# Patient Record
Sex: Female | Born: 1990 | Race: Black or African American | Hispanic: No | Marital: Single | Smoking: Never smoker
Health system: Southern US, Community
[De-identification: ages and names within clinical notes are randomized; demographics above are authoritative.]

## PROBLEM LIST (undated history)

## (undated) DIAGNOSIS — J45909 Unspecified asthma, uncomplicated: Secondary | ICD-10-CM

## (undated) DIAGNOSIS — D573 Sickle-cell trait: Secondary | ICD-10-CM

## (undated) HISTORY — PX: CHOLECYSTECTOMY: SHX55

---

## 2016-01-27 DIAGNOSIS — J45909 Unspecified asthma, uncomplicated: Secondary | ICD-10-CM | POA: Insufficient documentation

## 2016-01-27 DIAGNOSIS — K5904 Chronic idiopathic constipation: Secondary | ICD-10-CM | POA: Insufficient documentation

## 2016-01-27 DIAGNOSIS — D573 Sickle-cell trait: Secondary | ICD-10-CM | POA: Insufficient documentation

## 2016-08-20 ENCOUNTER — Encounter (HOSPITAL_COMMUNITY): Payer: Self-pay | Admitting: Emergency Medicine

## 2016-08-20 ENCOUNTER — Emergency Department (HOSPITAL_COMMUNITY)
Admission: EM | Admit: 2016-08-20 | Discharge: 2016-08-20 | Disposition: A | Discharge status: Home / Self Care | Payer: Self-pay | Attending: Emergency Medicine | Admitting: Emergency Medicine

## 2016-08-20 DIAGNOSIS — R110 Nausea: Secondary | ICD-10-CM | POA: Insufficient documentation

## 2016-08-20 DIAGNOSIS — R197 Diarrhea, unspecified: Secondary | ICD-10-CM | POA: Insufficient documentation

## 2016-08-20 HISTORY — DX: Unspecified asthma, uncomplicated: J45.909

## 2016-08-20 HISTORY — DX: Sickle-cell trait: D57.3

## 2016-08-20 LAB — COMPREHENSIVE METABOLIC PANEL
ALBUMIN: 3.8 g/dL (ref 3.5–5.0)
ALT: 108 U/L — AB (ref 14–54)
AST: 66 U/L — AB (ref 15–41)
Alkaline Phosphatase: 113 U/L (ref 38–126)
Anion gap: 7 (ref 5–15)
BILIRUBIN TOTAL: 0.5 mg/dL (ref 0.3–1.2)
BUN: 10 mg/dL (ref 6–20)
CHLORIDE: 105 mmol/L (ref 101–111)
CO2: 27 mmol/L (ref 22–32)
CREATININE: 0.81 mg/dL (ref 0.44–1.00)
Calcium: 9.4 mg/dL (ref 8.9–10.3)
GFR calc Af Amer: >60 mL/min (ref >60)
GFR calc non Af Amer: >60 mL/min (ref >60)
GLUCOSE: 96 mg/dL (ref 65–99)
POTASSIUM: 4 mmol/L (ref 3.5–5.1)
Sodium: 139 mmol/L (ref 135–145)
Total Protein: 7.5 g/dL (ref 6.5–8.1)

## 2016-08-20 LAB — CBC
HCT: 38.3 % (ref 36.0–46.0)
Hemoglobin: 13.6 g/dL (ref 12.0–15.0)
MCH: 28.3 pg (ref 26.0–34.0)
MCHC: 35.5 g/dL (ref 30.0–36.0)
MCV: 79.6 fL (ref 78.0–100.0)
PLATELETS: 261 10*3/uL (ref 150–400)
RBC: 4.81 MIL/uL (ref 3.87–5.11)
RDW: 13.9 % (ref 11.5–15.5)
WBC: 4.7 10*3/uL (ref 4.0–10.5)

## 2016-08-20 LAB — URINALYSIS, ROUTINE W REFLEX MICROSCOPIC
BILIRUBIN URINE: NEGATIVE
GLUCOSE, UA: NEGATIVE mg/dL
HGB URINE DIPSTICK: NEGATIVE
KETONES UR: NEGATIVE mg/dL
Leukocytes, UA: NEGATIVE
Nitrite: NEGATIVE
PH: 8 (ref 5.0–8.0)
Protein, ur: NEGATIVE mg/dL
Specific Gravity, Urine: 1.021 (ref 1.005–1.030)

## 2016-08-20 LAB — I-STAT BETA HCG BLOOD, ED (MC, WL, AP ONLY): I-stat hCG, quantitative: <5 m[IU]/mL (ref <5)

## 2016-08-20 LAB — LIPASE, BLOOD: Lipase: 20 U/L (ref 11–51)

## 2016-08-20 MED ORDER — ONDANSETRON 4 MG PO TBDP
4.0000 mg | ORAL_TABLET | Freq: Once | ORAL | Status: AC
  Administered 2016-08-20: 4 mg via ORAL
  Filled 2016-08-20: qty 1

## 2016-08-20 MED ORDER — ONDANSETRON HCL 4 MG PO TABS: 4.0000 mg | ORAL_TABLET | Freq: Four times a day (QID) | ORAL | 0 refills | Status: DC

## 2016-08-20 NOTE — Discharge Instructions (Signed)
Stay on clear liquids for the next 12 hours and then advance to the diarrhea diet. Take the medication for nausea as needed. Return for worsening symptoms

## 2016-08-20 NOTE — ED Notes (Signed)
Fluids given. Pt tolerating currently. Will continue to monitor.

## 2016-08-20 NOTE — ED Triage Notes (Signed)
Pt with upper abdominal pain for the last 2 days. Reports nausea but no vomiting. LMP Nov 26, 17. Pt unsure if pregnant. Reports fever to 101 two days ago. Pt awake, alert, NAD at present. VSS.

## 2016-08-20 NOTE — ED Provider Notes (Signed)
[facility]   CSN: [id number] Arrival date & time: 08/20/16  1808   By signing my name below, I, Clovis PuAvnee Patel, attest that this documentation has been prepared under the direction and in the presence of  Kerrie BuffaloHope Neese, NP. Electronically Signed: Clovis PuAvnee Patel, ED Scribe. 08/20/16. 9:06 PM.   History   Chief Complaint Chief Complaint  Patient presents with  . Abdominal Pain    The history is provided by the patient. No language interpreter was used.  Abdominal Pain   This is a new problem. The current episode started 2 days ago. The problem has not changed since onset.The pain is located in the suprapubic region. The pain is moderate. Associated symptoms include diarrhea, nausea and frequency. Pertinent negatives include fever, vomiting and dysuria. Nothing aggravates the symptoms. Nothing relieves the symptoms.    8:53 PM: Micah FlesherWent to see pt but she was not placed in the room.   HPI Comments:  Deanna Castaneda is a 26 y.o. female, with a PSHx of cholecystectomy, who presents to the Emergency Department complaining of epigastric abdominal pain x 2 days. Pt also reports nausea, diarrhea with loose stool, frequency and urgency. She also reports a cough and congestion. Pt denies fevers, chills, vomiting, diarrhea, dysuria, vaginal discharge, vaginal bleeding, sick contacts, birth control use, concerns for STDs and any other associated symptoms at this time. No PCP in area. No daily medication use or major medical problems.   Past Medical History:  Diagnosis Date  . Asthma   . Sickle cell trait (HCC)     There are no active problems to display for this patient.   Past Surgical History:  Procedure Laterality Date  . CHOLECYSTECTOMY      OB History    No data available       Home Medications    Prior to Admission medications   Medication Sig Start Date End Date Taking? Authorizing Provider  ondansetron (ZOFRAN) 4 MG tablet Take 1 tablet (4 mg total) by  mouth every 6 (six) hours. 08/20/16   Hope Orlene OchM Neese, NP    Family History History reviewed. No pertinent family history.  Social History Social History  Substance Use Topics  . Smoking status: Never Smoker  . Smokeless tobacco: Not on file  . Alcohol use No     Allergies   Patient has no known allergies.   Review of Systems Review of Systems  Constitutional: Negative for fever.  Gastrointestinal: Positive for abdominal pain, diarrhea and nausea. Negative for vomiting.  Genitourinary: Positive for frequency. Negative for dysuria.     Physical Exam Updated Vital Signs BP 121/76   Pulse 81   Temp 98.6 F (37 C) (Oral)   Resp 18   LMP 06/27/2016   SpO2 98%   Physical Exam  Constitutional: She is oriented to person, place, and time. She appears well-developed and well-nourished. No distress.  HENT:  Head: Normocephalic and atraumatic.  Eyes: Conjunctivae are normal.  Cardiovascular: Normal rate, regular rhythm and normal heart sounds.   Pulmonary/Chest: Effort normal.  Abdominal: Soft. She exhibits no distension. There is tenderness in the epigastric area.  Neurological: She is alert and oriented to person, place, and time.  Skin: Skin is warm and dry.  Psychiatric: She has a normal mood and affect.  Nursing note and vitals reviewed.   ED Treatments / Results  DIAGNOSTIC STUDIES:  Oxygen Saturation is 100% on RA, normal by my interpretation.    COORDINATION OF CARE:  9:02 PM  Discussed treatment plan with pt at bedside and pt agreed to plan.  Labs (all labs ordered are listed, but only abnormal results are displayed) Labs Reviewed  COMPREHENSIVE METABOLIC PANEL - Abnormal; Notable for the following:       Result Value   AST 66 (*)    ALT 108 (*)    All other components within normal limits  URINALYSIS, ROUTINE W REFLEX MICROSCOPIC - Abnormal; Notable for the following:    APPearance HAZY (*)    All other components within normal limits  LIPASE, BLOOD    CBC  I-STAT BETA HCG BLOOD, ED ([facility], [facility], [facility] ONLY)    Radiology No results found.  Procedures Procedures (including critical care time)  Medications Ordered in ED Medications  ondansetron (ZOFRAN-ODT) disintegrating tablet 4 mg (4 mg Oral Given 08/20/16 2135)     Initial Impression / Assessment and Plan / ED Course  I have reviewed the triage vital signs and the nursing notes.  Pertinent labs & imaging results that were available during my care of the patient were reviewed by me and considered in my medical decision making (see chart for details).     Patient is nontoxic, nonseptic appearing, in no apparent distress.  Patient's pain and other symptoms adequately managed in emergency department. Labs and vitals reviewed.  Patient does not meet the SIRS or Sepsis criteria.  Patient discharged home with symptomatic treatment and given strict instructions for follow-up with their primary care physician.  I have also discussed reasons to return immediately to the ER.  Patient expresses understanding and agrees with plan.   Final Clinical Impressions(s) / ED Diagnoses   Final diagnoses:  Diarrhea, unspecified type  Nausea    New Prescriptions Discharge Medication List as of 08/20/2016 10:22 PM    START taking these medications   Details  ondansetron (ZOFRAN) 4 MG tablet Take 1 tablet (4 mg total) by mouth every 6 (six) hours., Starting Fri 08/20/2016, Print      I personally performed the services described in this documentation, which was scribed in my presence. The recorded information has been reviewed and is accurate.     [address] [place], NP 08/21/16 1558    Blane Ohara, MD 08/23/16 [id number]

## 2016-10-06 ENCOUNTER — Other Ambulatory Visit: Payer: Self-pay | Admitting: Orthopedic Surgery

## 2016-10-06 DIAGNOSIS — M79601 Pain in right arm: Secondary | ICD-10-CM

## 2016-10-19 ENCOUNTER — Ambulatory Visit
Admission: RE | Admit: 2016-10-19 | Discharge: 2016-10-19 | Disposition: A | Discharge status: Home / Self Care | Payer: BLUE CROSS/BLUE SHIELD | Source: Ambulatory Visit | Attending: Orthopedic Surgery | Admitting: Orthopedic Surgery

## 2016-10-19 DIAGNOSIS — M79601 Pain in right arm: Secondary | ICD-10-CM

## 2016-11-25 ENCOUNTER — Encounter (HOSPITAL_COMMUNITY): Payer: Self-pay

## 2016-11-25 ENCOUNTER — Emergency Department (HOSPITAL_COMMUNITY)
Admission: EM | Admit: 2016-11-25 | Discharge: 2016-11-25 | Disposition: A | Discharge status: Home / Self Care | Payer: BLUE CROSS/BLUE SHIELD | Attending: Emergency Medicine | Admitting: Emergency Medicine

## 2016-11-25 DIAGNOSIS — M25511 Pain in right shoulder: Secondary | ICD-10-CM | POA: Diagnosis present

## 2016-11-25 DIAGNOSIS — M542 Cervicalgia: Secondary | ICD-10-CM | POA: Insufficient documentation

## 2016-11-25 DIAGNOSIS — M79601 Pain in right arm: Secondary | ICD-10-CM

## 2016-11-25 DIAGNOSIS — J45909 Unspecified asthma, uncomplicated: Secondary | ICD-10-CM | POA: Diagnosis not present

## 2016-11-25 MED ORDER — METHOCARBAMOL 500 MG PO TABS: 500.0000 mg | ORAL_TABLET | Freq: Two times a day (BID) | ORAL | 0 refills | Status: DC

## 2016-11-25 MED ORDER — PREDNISONE 10 MG PO TABS: 20.0000 mg | ORAL_TABLET | Freq: Two times a day (BID) | ORAL | 0 refills | Status: AC

## 2016-11-25 MED ORDER — IBUPROFEN 600 MG PO TABS: 600.0000 mg | ORAL_TABLET | Freq: Four times a day (QID) | ORAL | 0 refills | Status: DC | PRN

## 2016-11-25 MED ORDER — KETOROLAC TROMETHAMINE 60 MG/2ML IM SOLN
60.0000 mg | Freq: Once | INTRAMUSCULAR | Status: AC
  Administered 2016-11-25: 60 mg via INTRAMUSCULAR
  Filled 2016-11-25: qty 2

## 2016-11-25 MED ORDER — LIDOCAINE 5 % EX PTCH: 1.0000 | MEDICATED_PATCH | CUTANEOUS | 0 refills | Status: DC

## 2016-11-25 MED ORDER — DICLOFENAC SODIUM 1 % TD GEL: 4.0000 g | Freq: Four times a day (QID) | TRANSDERMAL | 0 refills | Status: DC

## 2016-11-25 NOTE — Discharge Instructions (Signed)
Take it easy, but do not lay around too much as this may make any stiffness worse.  Antiinflammatory medications: Take 500 mg of naproxen every 12 hours or 600 mg of ibuprofen every 6 hours for the next 3 days. Take these medications with food to avoid upset stomach. Choose only one of these medications, do not take them together.  Diclofenac: If the ibuprofen or naproxen are not improving the pain, stop taking the ibuprofen or naproxen and begin using the diclofenac gel.  Prednisone: Take the prednisone as prescribed.  Muscle relaxer: Robaxin is a muscle relaxer and may help loosen stiff muscles. Do not take the Robaxin while driving or performing other dangerous activities.   Lidocaine patches: These are available via either prescription or over-the-counter. The over-the-counter option may be more economical one and are likely just as effective. There are multiple over-the-counter brands, such as Salonpas.  Exercises: Be sure to perform the attached exercises starting with three times a week and working up to performing them daily. This is an essential part of preventing long term problems. Note: The attached sheet labeled "Cervical Strain and Sprain" is included for the sake of the exercises, you are not being diagnosed with this medication condition.  Follow-up with the orthopedic specialists, as scheduled.

## 2016-11-25 NOTE — ED Triage Notes (Signed)
Patient here with chronic pain to right shoulder and arm, states that the pain now radiating up neck. Had work injury in November. Has been seen by ortho ongoing for same

## 2016-11-25 NOTE — ED Notes (Signed)
Pt is in stable condition upon d/c and ambulates from ED. 
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
## 2017-01-21 ENCOUNTER — Other Ambulatory Visit (HOSPITAL_COMMUNITY): Payer: Self-pay | Admitting: Obstetrics & Gynecology

## 2017-01-21 DIAGNOSIS — Z3141 Encounter for fertility testing: Secondary | ICD-10-CM

## 2017-01-26 ENCOUNTER — Ambulatory Visit (HOSPITAL_COMMUNITY)
Admission: RE | Admit: 2017-01-26 | Discharge: 2017-01-26 | Disposition: A | Discharge status: Home / Self Care | Payer: BLUE CROSS/BLUE SHIELD | Source: Ambulatory Visit | Attending: Obstetrics & Gynecology | Admitting: Obstetrics & Gynecology

## 2017-01-26 DIAGNOSIS — Z3141 Encounter for fertility testing: Secondary | ICD-10-CM

## 2017-01-26 MED ORDER — IOPAMIDOL (ISOVUE-300) INJECTION 61%
30.0000 mL | Freq: Once | INTRAVENOUS | Status: AC | PRN
  Administered 2017-01-26: 6 mL

## 2017-02-15 ENCOUNTER — Emergency Department (HOSPITAL_COMMUNITY): Payer: No Typology Code available for payment source

## 2017-02-15 ENCOUNTER — Emergency Department (HOSPITAL_COMMUNITY)
Admission: EM | Admit: 2017-02-15 | Discharge: 2017-02-15 | Disposition: A | Discharge status: Home / Self Care | Payer: No Typology Code available for payment source | Attending: Emergency Medicine | Admitting: Emergency Medicine

## 2017-02-15 ENCOUNTER — Other Ambulatory Visit (HOSPITAL_COMMUNITY): Payer: Self-pay

## 2017-02-15 ENCOUNTER — Encounter (HOSPITAL_COMMUNITY): Payer: Self-pay

## 2017-02-15 DIAGNOSIS — M791 Myalgia, unspecified site: Secondary | ICD-10-CM

## 2017-02-15 DIAGNOSIS — Y9389 Activity, other specified: Secondary | ICD-10-CM | POA: Diagnosis not present

## 2017-02-15 DIAGNOSIS — Y998 Other external cause status: Secondary | ICD-10-CM | POA: Insufficient documentation

## 2017-02-15 DIAGNOSIS — Z79899 Other long term (current) drug therapy: Secondary | ICD-10-CM | POA: Insufficient documentation

## 2017-02-15 DIAGNOSIS — Z9049 Acquired absence of other specified parts of digestive tract: Secondary | ICD-10-CM | POA: Insufficient documentation

## 2017-02-15 DIAGNOSIS — D573 Sickle-cell trait: Secondary | ICD-10-CM | POA: Insufficient documentation

## 2017-02-15 DIAGNOSIS — M545 Low back pain, unspecified: Secondary | ICD-10-CM

## 2017-02-15 DIAGNOSIS — Y92410 Unspecified street and highway as the place of occurrence of the external cause: Secondary | ICD-10-CM | POA: Insufficient documentation

## 2017-02-15 DIAGNOSIS — J45909 Unspecified asthma, uncomplicated: Secondary | ICD-10-CM | POA: Insufficient documentation

## 2017-02-15 LAB — POC URINE PREG, ED: Preg Test, Ur: NEGATIVE

## 2017-02-15 MED ORDER — IBUPROFEN 800 MG PO TABS: 800.0000 mg | ORAL_TABLET | Freq: Three times a day (TID) | ORAL | 0 refills | Status: DC | PRN

## 2017-02-15 MED ORDER — METHOCARBAMOL 500 MG PO TABS: 500.0000 mg | ORAL_TABLET | Freq: Two times a day (BID) | ORAL | 0 refills | Status: DC | PRN

## 2017-02-15 MED ORDER — IBUPROFEN 800 MG PO TABS
800.0000 mg | ORAL_TABLET | Freq: Once | ORAL | Status: AC
  Administered 2017-02-15: 800 mg via ORAL
  Filled 2017-02-15: qty 1

## 2017-02-15 MED ORDER — METHOCARBAMOL 1000 MG/10ML IJ SOLN: 1000.0000 mg | Freq: Once | INTRAMUSCULAR | Status: DC

## 2017-02-15 MED ORDER — METHOCARBAMOL 500 MG PO TABS
500.0000 mg | ORAL_TABLET | Freq: Once | ORAL | Status: AC
  Administered 2017-02-15: 500 mg via ORAL
  Filled 2017-02-15: qty 1
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
## 2017-02-15 NOTE — ED Notes (Signed)
Pt returned from X-ray.  
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
## 2017-08-03 ENCOUNTER — Encounter (HOSPITAL_COMMUNITY): Payer: Self-pay | Admitting: *Deleted

## 2017-08-03 ENCOUNTER — Other Ambulatory Visit: Payer: Self-pay

## 2017-08-03 ENCOUNTER — Emergency Department (HOSPITAL_COMMUNITY)
Admission: EM | Admit: 2017-08-03 | Discharge: 2017-08-03 | Disposition: A | Discharge status: Home / Self Care | Payer: BLUE CROSS/BLUE SHIELD | Attending: Emergency Medicine | Admitting: Emergency Medicine

## 2017-08-03 ENCOUNTER — Emergency Department (HOSPITAL_COMMUNITY): Payer: BLUE CROSS/BLUE SHIELD

## 2017-08-03 DIAGNOSIS — R112 Nausea with vomiting, unspecified: Secondary | ICD-10-CM | POA: Insufficient documentation

## 2017-08-03 DIAGNOSIS — B9689 Other specified bacterial agents as the cause of diseases classified elsewhere: Secondary | ICD-10-CM | POA: Insufficient documentation

## 2017-08-03 DIAGNOSIS — R197 Diarrhea, unspecified: Secondary | ICD-10-CM | POA: Insufficient documentation

## 2017-08-03 DIAGNOSIS — J45909 Unspecified asthma, uncomplicated: Secondary | ICD-10-CM | POA: Insufficient documentation

## 2017-08-03 DIAGNOSIS — R1084 Generalized abdominal pain: Secondary | ICD-10-CM

## 2017-08-03 DIAGNOSIS — N760 Acute vaginitis: Secondary | ICD-10-CM

## 2017-08-03 DIAGNOSIS — R509 Fever, unspecified: Secondary | ICD-10-CM | POA: Insufficient documentation

## 2017-08-03 LAB — COMPREHENSIVE METABOLIC PANEL
ALT: 74 U/L — ABNORMAL HIGH (ref 14–54)
AST: 61 U/L — AB (ref 15–41)
Albumin: 3.2 g/dL — ABNORMAL LOW (ref 3.5–5.0)
Alkaline Phosphatase: 80 U/L (ref 38–126)
Anion gap: 6 (ref 5–15)
BILIRUBIN TOTAL: 0.6 mg/dL (ref 0.3–1.2)
BUN: 13 mg/dL (ref 6–20)
CALCIUM: 9.2 mg/dL (ref 8.9–10.3)
CO2: 23 mmol/L (ref 22–32)
CREATININE: 0.82 mg/dL (ref 0.44–1.00)
Chloride: 107 mmol/L (ref 101–111)
Glucose, Bld: 114 mg/dL — ABNORMAL HIGH (ref 65–99)
Potassium: 3.5 mmol/L (ref 3.5–5.1)
Sodium: 136 mmol/L (ref 135–145)
TOTAL PROTEIN: 7.4 g/dL (ref 6.5–8.1)

## 2017-08-03 LAB — GC/CHLAMYDIA PROBE AMP (~~LOC~~) NOT AT ARMC
Chlamydia: NEGATIVE
Neisseria Gonorrhea: NEGATIVE

## 2017-08-03 LAB — CBC
HCT: 35.4 % — ABNORMAL LOW (ref 36.0–46.0)
Hemoglobin: 12.3 g/dL (ref 12.0–15.0)
MCH: 27.8 pg (ref 26.0–34.0)
MCHC: 34.7 g/dL (ref 30.0–36.0)
MCV: 80.1 fL (ref 78.0–100.0)
PLATELETS: 298 10*3/uL (ref 150–400)
RBC: 4.42 MIL/uL (ref 3.87–5.11)
RDW: 14.3 % (ref 11.5–15.5)
WBC: 6.3 10*3/uL (ref 4.0–10.5)

## 2017-08-03 LAB — URINALYSIS, ROUTINE W REFLEX MICROSCOPIC
Bilirubin Urine: NEGATIVE
GLUCOSE, UA: NEGATIVE mg/dL
Hgb urine dipstick: NEGATIVE
KETONES UR: NEGATIVE mg/dL
LEUKOCYTES UA: NEGATIVE
NITRITE: NEGATIVE
PROTEIN: NEGATIVE mg/dL
Specific Gravity, Urine: 1.027 (ref 1.005–1.030)
pH: 5 (ref 5.0–8.0)

## 2017-08-03 LAB — I-STAT BETA HCG BLOOD, ED (MC, WL, AP ONLY)

## 2017-08-03 LAB — WET PREP, GENITAL
SPERM: NONE SEEN
Trich, Wet Prep: NONE SEEN
YEAST WET PREP: NONE SEEN

## 2017-08-03 LAB — LIPASE, BLOOD: Lipase: 31 U/L (ref 11–51)

## 2017-08-03 MED ORDER — IOPAMIDOL (ISOVUE-300) INJECTION 61%
INTRAVENOUS | Status: AC
  Administered 2017-08-03: 100 mL
  Filled 2017-08-03: qty 100

## 2017-08-03 MED ORDER — METRONIDAZOLE 500 MG PO TABS
500.0000 mg | ORAL_TABLET | Freq: Once | ORAL | Status: AC
  Administered 2017-08-03: 500 mg via ORAL
  Filled 2017-08-03: qty 1

## 2017-08-03 MED ORDER — MORPHINE SULFATE (PF) 4 MG/ML IV SOLN
4.0000 mg | Freq: Once | INTRAVENOUS | Status: AC
  Administered 2017-08-03: 4 mg via INTRAVENOUS
  Filled 2017-08-03: qty 1

## 2017-08-03 MED ORDER — ONDANSETRON HCL 4 MG/2ML IJ SOLN
4.0000 mg | Freq: Once | INTRAMUSCULAR | Status: AC
  Administered 2017-08-03: 4 mg via INTRAVENOUS
  Filled 2017-08-03: qty 2

## 2017-08-03 MED ORDER — SODIUM CHLORIDE 0.9 % IV BOLUS (SEPSIS)
1000.0000 mL | Freq: Once | INTRAVENOUS | Status: AC
  Administered 2017-08-03: 1000 mL via INTRAVENOUS

## 2017-08-03 MED ORDER — METRONIDAZOLE 500 MG PO TABS: 500.0000 mg | ORAL_TABLET | Freq: Two times a day (BID) | ORAL | 0 refills | Status: DC

## 2017-08-03 NOTE — ED Provider Notes (Signed)
[facility]  Refer to provider note for full history and physical examination.  Briefly, patient is a 27 year old female with a history of cholecystectomy complaining of diffuse abdominal pain as well as nausea, vomiting, and diarrhea.  Workup is reassuring.  Awaiting wet prep.  If wet prep is normal, patient stable for discharge.  If wet prep is abnormal, will treat for abnormalities.  She declined STD testing. Physical Exam  BP 95/67 (BP Location: Left Arm)   Pulse 79   Temp 98.6 F (37 C) (Oral)   Resp 14   Ht 5\' 2"  (1.575 m)   Wt 90.7 kg (200 lb)   LMP 07/03/2017   SpO2 100%   BMI 36.58 kg/m   Physical Exam  Constitutional: She appears well-developed and well-nourished. No distress.  HENT:  Head: Normocephalic and atraumatic.  Eyes: Conjunctivae are normal. Right eye exhibits no discharge. Left eye exhibits no discharge.  Neck: No JVD present. No tracheal deviation present.  Cardiovascular: Normal rate, normal heart sounds and intact distal pulses.  Pulmonary/Chest: Effort normal and breath sounds normal.  Abdominal: Soft. Bowel sounds are normal. She exhibits no distension. There is tenderness in the left upper quadrant.  Mild left upper quadrant tenderness  Musculoskeletal: She exhibits no edema.  Neurological: She is alert.  Skin: Skin is warm and dry. No erythema.  Psychiatric: She has a normal mood and affect. Her behavior is normal.  Nursing note and vitals reviewed.   ED Course/Procedures     Procedures  MDM  8:35 AM  Patient resting comfortably in no apparent distress.  She is complaining of mild left upper quadrant tenderness but otherwise states she is feeling better and has been tolerating p.o. food and fluids.  Wet prep shows WBCs and clue cells consistent with BV.  We will treat with Flagyl.  [facility].  Discussed indications for return to the ED. Pt verbalized understanding of and  agreement with plan and is safe for discharge home at this time.  No complaints prior to discharge.         Deanna Castaneda 08/03/17 [id number]    Deanna Castaneda 08/03/17 2110

## 2017-08-03 NOTE — ED Triage Notes (Signed)
The pt is c/o abd pain and vomiting today  lmp  Dec 1st

## 2017-08-03 NOTE — ED Provider Notes (Signed)
[place] EMERGENCY DEPARTMENT Provider Note   CSN: [id number] Arrival date & time: 08/03/17  0025     History   Chief Complaint Chief Complaint  Patient presents with  . Abdominal Pain    HPI Deanna Castaneda is a 27 y.o. female past medical history of cholecystectomy who presents for evaluation of multiple complaints.  Patient reports that over the last few days, she has had diffuse abdominal pain.  Patient also reports associated nausea.  Patient reports that she had one episode of vomiting tonight approximate 7 PM.  Nonbloody, nonbilious.  Patient reports that she had one episode of diarrhea today.  Patient reports that she felt subjective fever and chills but did not measure any fever.  Patient states that the abdominal pain has been intermittent for the last several days.  She denies any alleviating or aggravating factors.  She is not taking any medications for the pain.  Patient reports that she is still been able to tolerate p.o. but reports nausea.  Patient also reports that over the last few weeks, she has had intermittent vaginal bleeding.  She states she is only going through about 1 pad a day.  She denies any presence of clots.  She had arrange for an appointment with the health department for evaluation of vaginal bleeding.  Patient reports she is currently sexually active with one partner.  They do not use protection.  Patient denies any chest pain, difficulty breathing, dysuria, hematuria, vaginal discharge.  The history is provided by the patient.    Past Medical History:  Diagnosis Date  . Asthma   . Sickle cell trait ([place])     There are no active problems to display for this patient.   Past Surgical History:  Procedure Laterality Date  . CHOLECYSTECTOMY      OB History    No data available       Home Medications    Prior to Admission medications   Medication Sig Start Date End Date Taking? Authorizing Provider  diclofenac sodium  (VOLTAREN) 1 % GEL Apply 4 g topically 4 (four) times daily. Patient not taking: Reported on 08/03/2017 11/25/16   Lorayne Bender, PA-C  ibuprofen (ADVIL,MOTRIN) 800 MG tablet Take 1 tablet (800 mg total) by mouth every 8 (eight) hours as needed. Patient not taking: Reported on 08/03/2017 02/15/17   Ward, Ozella Almond, PA-C  lidocaine (LIDODERM) 5 % Place 1 patch onto the skin daily. Remove & Discard patch within 12 hours or as directed by MD Patient not taking: Reported on 08/03/2017 11/25/16   Joy, Helane Gunther, PA-C  methocarbamol (ROBAXIN) 500 MG tablet Take 1 tablet (500 mg total) by mouth 2 (two) times daily as needed for muscle spasms. Patient not taking: Reported on 08/03/2017 02/15/17   Ward, Ozella Almond, PA-C  ondansetron (ZOFRAN) 4 MG tablet Take 1 tablet (4 mg total) by mouth every 6 (six) hours. Patient not taking: Reported on 08/03/2017 08/20/16   Ashley Murrain, NP    Family History No family history on file.  Social History Social History   Tobacco Use  . Smoking status: Never Smoker  . Smokeless tobacco: Never Used  Substance Use Topics  . Alcohol use: No  . Drug use: No     Allergies   Patient has no known allergies.   Review of Systems Review of Systems  Constitutional: Positive for fever (Subjective). Negative for chills.  HENT: Negative for congestion.   Eyes: Negative for visual disturbance.  Respiratory: Negative for cough and shortness of breath.   Cardiovascular: Negative for chest pain.  Gastrointestinal: Positive for abdominal pain, diarrhea, nausea and vomiting.  Genitourinary: Positive for vaginal bleeding. Negative for dysuria, hematuria and vaginal discharge.  Musculoskeletal: Negative for back pain and neck pain.  Skin: Negative for rash.     Physical Exam Updated Vital Signs BP 95/67 (BP Location: Left Arm)   Pulse 79   Temp 98.6 F (37 C) (Oral)   Resp 14   Ht 5' 2"  (1.575 m)   Wt 90.7 kg (200 lb)   LMP 07/03/2017   SpO2 100%   BMI 36.58 kg/m    Physical Exam  Constitutional: She is oriented to person, place, and time. She appears well-developed and well-nourished.  HENT:  Head: Normocephalic and atraumatic.  Mouth/Throat: Oropharynx is clear and moist and mucous membranes are normal.  Eyes: Conjunctivae, EOM and lids are normal. Pupils are equal, round, and reactive to light.  Neck: Full passive range of motion without pain.  Cardiovascular: Normal rate, regular rhythm, normal heart sounds and normal pulses. Exam reveals no gallop and no friction rub.  No murmur heard. Pulmonary/Chest: Effort normal and breath sounds normal.  Abdominal: Soft. Normal appearance. There is generalized tenderness and tenderness in the suprapubic area. There is no rigidity, no guarding and no tenderness at McBurney's point.  Abdomen is soft, nondistended.  Patient has generalized tenderness but does have some focal suprapubic tenderness.  No CVA tenderness bilaterally.  No tenderness at McBurney's point. No rigidity or guarding.   Genitourinary: Vagina normal and uterus normal. Cervix exhibits no motion tenderness, no discharge and no friability. Right adnexum displays no mass and no tenderness. Left adnexum displays no mass and no tenderness.  Genitourinary Comments: The exam was performed with a chaperone present. Normal external female genitalia. No lesions, rash, or sores.  No evidence of mass, fluctuance, swelling, erythema of the labia majora.  No evidence of Bartholin's abscess.  Patient has no tenderness palpation of the labia. No blood in the vaginal vault. Small bloody discharge at the cervix. No friability. No CMT.  No adnexal mass or tenderness bilaterally.  Musculoskeletal: Normal range of motion.  Neurological: She is alert and oriented to person, place, and time.  Skin: Skin is warm and dry. Capillary refill takes less than 2 seconds.  Psychiatric: She has a normal mood and affect. Her speech is normal.  Nursing note and vitals  reviewed.    ED Treatments / Results  Labs (all labs ordered are listed, but only abnormal results are displayed) Labs Reviewed  COMPREHENSIVE METABOLIC PANEL - Abnormal; Notable for the following components:      Result Value   Glucose, Bld 114 (*)    Albumin 3.2 (*)    AST 61 (*)    ALT 74 (*)    All other components within normal limits  CBC - Abnormal; Notable for the following components:   HCT 35.4 (*)    All other components within normal limits  WET PREP, GENITAL  LIPASE, BLOOD  URINALYSIS, ROUTINE W REFLEX MICROSCOPIC  I-STAT BETA HCG BLOOD, ED ([facility], [facility], [facility] ONLY)  GC/CHLAMYDIA PROBE AMP ([place]) NOT AT [facility]    EKG  EKG Interpretation None       Radiology Ct Abdomen Pelvis W Contrast  Result Date: 08/03/2017 CLINICAL DATA:  Chronic intermittent generalized abdominal pain, nausea and vomiting. EXAM: CT ABDOMEN AND PELVIS WITH CONTRAST TECHNIQUE: Multidetector CT imaging of the abdomen and pelvis was  performed using the standard protocol following bolus administration of intravenous contrast. CONTRAST:  139m ISOVUE-300 IOPAMIDOL (ISOVUE-300) INJECTION 61% COMPARISON:  Lumbar spine radiographs performed 02/15/2017 FINDINGS: Lower chest: The visualized lung bases are grossly clear. The visualized portions of the mediastinum are unremarkable. Hepatobiliary: The liver is unremarkable in appearance. The patient is status post cholecystectomy, with clips noted at the gallbladder fossa. The common bile duct remains normal in caliber. Pancreas: The pancreas is within normal limits. Spleen: The spleen is unremarkable in appearance. Adrenals/Urinary Tract: The adrenal glands are unremarkable in appearance. The kidneys are within normal limits. There is no evidence of hydronephrosis. No renal or ureteral stones are identified. No perinephric stranding is seen. Stomach/Bowel: The stomach is unremarkable in appearance. The small bowel is within normal limits. The appendix is normal in  caliber, without evidence of appendicitis. The colon is unremarkable in appearance. Vascular/Lymphatic: The abdominal aorta is unremarkable in appearance. The inferior vena cava is grossly unremarkable. No retroperitoneal lymphadenopathy is seen. No pelvic sidewall lymphadenopathy is identified. Reproductive: The bladder is mildly distended and grossly unremarkable. Small nabothian cysts are noted at the cervix. The uterus is grossly unremarkable. The ovaries are relatively symmetric. No suspicious adnexal masses are seen. Trace free fluid within the pelvis is likely physiologic in nature. A 2.0 cm left-sided Bartholin's gland cyst is noted. Other: No additional soft tissue abnormalities are seen. Musculoskeletal: No acute osseous abnormalities are identified. The visualized musculature is unremarkable in appearance. IMPRESSION: 1. No definite acute abnormality seen to explain the patient's symptoms. 2. 2.0 cm left-sided Bartholin's gland cyst noted. Would correlate for associated symptoms. Electronically Signed   By: JGarald BaldingM.D.   On: 08/03/2017 06:15    Procedures Procedures (including critical care time)  Medications Ordered in ED Medications  sodium chloride 0.9 % bolus 1,000 mL (0 mLs Intravenous Stopped 08/03/17 0735)  morphine 4 MG/ML injection 4 mg (4 mg Intravenous Given 08/03/17 0433)  iopamidol (ISOVUE-300) 61 % injection (100 mLs  Contrast Given 08/03/17 0500)  ondansetron (ZOFRAN) injection 4 mg (4 mg Intravenous Given 08/03/17 0508)     Initial Impression / Assessment and Plan / ED Course  I have reviewed the triage vital signs and the nursing notes.  Pertinent labs & imaging results that were available during my care of the patient were reviewed by me and considered in my medical decision making (see chart for details).     27y.o. F presents for evaluation of abdominal pain that has been intermittent for the last few days.  Patient diarrhea last night.  No blood.  Decreased  appetite.  Also reports that she has had several weeks of intermittent vaginal bleeding.  She reports that she is going through approximately 1 pad per day.  Patient is afebrile, non-toxic appearing, sitting comfortably on examination table. Vital signs reviewed and stable.  Physical exam shows diffuse abdominal tenderness.  Consider acute infectious etiology.  History/physical exam not concerning for appendicitis, diverticulitis, ovarian torsion. History/physpical exam are nto concerning for tubo-ovarian abscess. Initial labs ordered at triage.  We will add additional CT abdomen given abdominal tenderness on exam. Analgesics provided in the department. IVF given for fluid resuscitation  Labs and imaging reviewed.  Lipase unremarkable.  CBC unremarkable.  CMP shows slight bump in AST and ALT.records reviewed show that patient has had previous bumps in her AST and ALT.  They are actually decreased from most recent lab work.  No abnormalities of bilirubin or alk phos.  UA is negative  for any acute signs of infectious etiology.  I-STAT beta is negative.   CT abdomen pelvis shows a 2 cm left-sided Bartholin's gland cyst.  No other acute abnormalities.  Pelvic exam as documented above.  Patient with no vaginal discharge.  She has some mild bleeding and irritation of the cervix but no lesions noted.  No CMT.  No adnexal mass or tenderness.  No uterine tenderness.  On pelvic exam, there is no identifiable mass, fluctuance, tenderness.  Patient has no tenderness palpation of the labia.  No evidence of Bartholin's abscess.  No identifiable abscess for drainage. No indications for emergent I&D at this time. Discussed results with patient.  Instructed patient to follow-up with [facility] for further evaluation.  Reevaluation.  Patient reports improvement in pain.  Repeat abdominal exam is improved.  Vitals are stable.  I discussed results with patient.  Explained to patient that gonorrhea and  chlamydia will be back in 2-3 days.  Patient does not wish to have prophylactic treatment at this time.  Wet prep is pending.  Instructed patient that if wet prep is normal, will plan to dispo with outpatient referral for OB/GYN evaluation of cyst and intermittent vaginal bleeding. Patient had ample opportunity for questions and discussion. All patient's questions were answered with full understanding.  Patient signed out to [facility], PA-C with wet prep pending.  Anticipate discharge home.  If white prep unremarkable, patient to follow-up with OB/GYN.  If abnormality seen on wet prep, will start antibiotic therapy.   Final Clinical Impressions(s) / ED Diagnoses   Final diagnoses:  Generalized abdominal pain    ED Discharge Orders    None       Volanda Napoleon, PA-C 08/03/17 1406    Ward, Delice Bison, DO 08/03/17 2328

## 2017-08-03 NOTE — Discharge Instructions (Addendum)
Please take all of your antibiotics until finished!   You may develop abdominal discomfort or diarrhea from the antibiotic.  You may help offset this with probiotics which you can buy or get in yogurt. Do not eat  or take the probiotics until 2 hours after your antibiotic.   You can take Tylenol or Ibuprofen as directed for pain. You can alternate Tylenol and Ibuprofen every 4 hours. If you take Tylenol at 1pm, then you can take Ibuprofen at 5pm. Then you can take Tylenol again at 9pm.   Follow-up with the referred OB/GYN office for further evaluation regarding the findings on the CT and your intermittent vaginal bleeding.   Return to the Emergency Department fro any fever, worsening abdominal pain, chest pain, vaginal discharge, pain with urination or any other worsening or concerning symptoms.

## 2017-09-06 ENCOUNTER — Other Ambulatory Visit: Payer: Self-pay

## 2017-09-06 ENCOUNTER — Encounter (HOSPITAL_COMMUNITY): Payer: Self-pay

## 2017-09-06 ENCOUNTER — Emergency Department (HOSPITAL_COMMUNITY): Payer: BLUE CROSS/BLUE SHIELD

## 2017-09-06 DIAGNOSIS — Z5321 Procedure and treatment not carried out due to patient leaving prior to being seen by health care provider: Secondary | ICD-10-CM | POA: Diagnosis not present

## 2017-09-06 DIAGNOSIS — R079 Chest pain, unspecified: Secondary | ICD-10-CM | POA: Insufficient documentation

## 2017-09-06 LAB — I-STAT TROPONIN, ED: Troponin i, poc: 0 ng/mL (ref 0.00–0.08)

## 2017-09-06 LAB — CBC
HCT: 35.9 % — ABNORMAL LOW (ref 36.0–46.0)
Hemoglobin: 12.7 g/dL (ref 12.0–15.0)
MCH: 28.4 pg (ref 26.0–34.0)
MCHC: 35.4 g/dL (ref 30.0–36.0)
MCV: 80.3 fL (ref 78.0–100.0)
PLATELETS: 278 10*3/uL (ref 150–400)
RBC: 4.47 MIL/uL (ref 3.87–5.11)
RDW: 13.9 % (ref 11.5–15.5)
WBC: 6.1 10*3/uL (ref 4.0–10.5)

## 2017-09-06 LAB — I-STAT BETA HCG BLOOD, ED (MC, WL, AP ONLY)

## 2017-09-06 NOTE — ED Triage Notes (Signed)
Pt states that since Sun she has had a cough, headache, bodyaches, CP with SOB, non radiating, nausea and one episode of diarrhea today.

## 2017-09-07 ENCOUNTER — Emergency Department (HOSPITAL_COMMUNITY)
Admission: EM | Admit: 2017-09-07 | Discharge: 2017-09-07 | Disposition: A | Discharge status: Home / Self Care | Payer: BLUE CROSS/BLUE SHIELD | Attending: Emergency Medicine | Admitting: Emergency Medicine

## 2017-09-07 LAB — BASIC METABOLIC PANEL
Anion gap: 10 (ref 5–15)
BUN: 14 mg/dL (ref 6–20)
CALCIUM: 8.9 mg/dL (ref 8.9–10.3)
CO2: 20 mmol/L — ABNORMAL LOW (ref 22–32)
Chloride: 107 mmol/L (ref 101–111)
Creatinine, Ser: 0.83 mg/dL (ref 0.44–1.00)
GFR calc Af Amer: >60 mL/min (ref >60)
GLUCOSE: 109 mg/dL — AB (ref 65–99)
Potassium: 3.8 mmol/L (ref 3.5–5.1)
Sodium: 137 mmol/L (ref 135–145)

## 2017-09-07 NOTE — ED Notes (Signed)
Pt to NF asking about wait time and then seen observed leaving the ED

## 2017-09-07 NOTE — ED Notes (Addendum)
09/07/17 0645 AM - pt returned to ED from outside asking if her name had been called. Pt informed that we had called for her but she did not answer. Taken out as a LWBS. Informed patient that since she left hospital, she has check back in again and start over. Pt wanting to know if she has to wait behind everyone visible in the lobby, pt told yes by RN. Pt left hospital again.

## 2018-01-17 ENCOUNTER — Other Ambulatory Visit: Payer: Self-pay

## 2018-01-17 ENCOUNTER — Emergency Department (HOSPITAL_COMMUNITY)
Admission: EM | Admit: 2018-01-17 | Discharge: 2018-01-18 | Disposition: A | Discharge status: Home / Self Care | Payer: Self-pay | Attending: Emergency Medicine | Admitting: Emergency Medicine

## 2018-01-17 ENCOUNTER — Encounter (HOSPITAL_COMMUNITY): Payer: Self-pay

## 2018-01-17 DIAGNOSIS — R7989 Other specified abnormal findings of blood chemistry: Secondary | ICD-10-CM | POA: Insufficient documentation

## 2018-01-17 DIAGNOSIS — R102 Pelvic and perineal pain: Secondary | ICD-10-CM

## 2018-01-17 DIAGNOSIS — J45909 Unspecified asthma, uncomplicated: Secondary | ICD-10-CM | POA: Insufficient documentation

## 2018-01-17 DIAGNOSIS — R945 Abnormal results of liver function studies: Secondary | ICD-10-CM

## 2018-01-17 DIAGNOSIS — N739 Female pelvic inflammatory disease, unspecified: Secondary | ICD-10-CM | POA: Insufficient documentation

## 2018-01-17 DIAGNOSIS — N730 Acute parametritis and pelvic cellulitis: Secondary | ICD-10-CM

## 2018-01-17 DIAGNOSIS — R1030 Lower abdominal pain, unspecified: Secondary | ICD-10-CM | POA: Insufficient documentation

## 2018-01-17 LAB — CBC
HEMATOCRIT: 36.6 % (ref 36.0–46.0)
Hemoglobin: 12.6 g/dL (ref 12.0–15.0)
MCH: 27.6 pg (ref 26.0–34.0)
MCHC: 34.4 g/dL (ref 30.0–36.0)
MCV: 80.1 fL (ref 78.0–100.0)
PLATELETS: 287 10*3/uL (ref 150–400)
RBC: 4.57 MIL/uL (ref 3.87–5.11)
RDW: 13.6 % (ref 11.5–15.5)
WBC: 7.8 10*3/uL (ref 4.0–10.5)

## 2018-01-17 LAB — URINALYSIS, ROUTINE W REFLEX MICROSCOPIC
Bilirubin Urine: NEGATIVE
Glucose, UA: NEGATIVE mg/dL
Hgb urine dipstick: NEGATIVE
Ketones, ur: NEGATIVE mg/dL
LEUKOCYTES UA: NEGATIVE
NITRITE: NEGATIVE
PROTEIN: NEGATIVE mg/dL
SPECIFIC GRAVITY, URINE: 1.021 (ref 1.005–1.030)
pH: 8 (ref 5.0–8.0)

## 2018-01-17 LAB — COMPREHENSIVE METABOLIC PANEL
ALBUMIN: 3.3 g/dL — AB (ref 3.5–5.0)
ALK PHOS: 76 U/L (ref 38–126)
ALT: 80 U/L — ABNORMAL HIGH (ref 14–54)
AST: 59 U/L — ABNORMAL HIGH (ref 15–41)
Anion gap: 7 (ref 5–15)
BILIRUBIN TOTAL: 0.4 mg/dL (ref 0.3–1.2)
BUN: 12 mg/dL (ref 6–20)
CALCIUM: 9.3 mg/dL (ref 8.9–10.3)
CO2: 24 mmol/L (ref 22–32)
Chloride: 107 mmol/L (ref 101–111)
Creatinine, Ser: 0.77 mg/dL (ref 0.44–1.00)
GFR calc non Af Amer: >60 mL/min (ref >60)
GLUCOSE: 99 mg/dL (ref 65–99)
POTASSIUM: 3.7 mmol/L (ref 3.5–5.1)
SODIUM: 138 mmol/L (ref 135–145)
TOTAL PROTEIN: 7.3 g/dL (ref 6.5–8.1)

## 2018-01-17 LAB — I-STAT BETA HCG BLOOD, ED (MC, WL, AP ONLY)

## 2018-01-17 LAB — LIPASE, BLOOD: Lipase: 33 U/L (ref 11–51)

## 2018-01-17 NOTE — ED Triage Notes (Signed)
Onset 3 days sharp lower abd pain and nausea.  Onset today shortness of breath, and headache.  No urinary symptoms.

## 2018-01-18 LAB — WET PREP, GENITAL
Sperm: NONE SEEN
TRICH WET PREP: NONE SEEN
Yeast Wet Prep HPF POC: NONE SEEN

## 2018-01-18 MED ORDER — CEFTRIAXONE SODIUM 250 MG IJ SOLR
250.0000 mg | Freq: Once | INTRAMUSCULAR | Status: AC
  Administered 2018-01-18: 250 mg via INTRAMUSCULAR
  Filled 2018-01-18: qty 250

## 2018-01-18 MED ORDER — HYDROCODONE-ACETAMINOPHEN 5-325 MG PO TABS: 1.0000 | ORAL_TABLET | Freq: Four times a day (QID) | ORAL | 0 refills | Status: DC | PRN

## 2018-01-18 MED ORDER — LIDOCAINE HCL (PF) 1 % IJ SOLN
INTRAMUSCULAR | Status: AC
  Administered 2018-01-18: 2 mL
  Filled 2018-01-18: qty 5

## 2018-01-18 MED ORDER — HYDROCODONE-ACETAMINOPHEN 5-325 MG PO TABS: 2.0000 | ORAL_TABLET | ORAL | 0 refills | Status: DC | PRN

## 2018-01-18 MED ORDER — DOXYCYCLINE HYCLATE 100 MG PO TABS
100.0000 mg | ORAL_TABLET | Freq: Once | ORAL | Status: AC
  Administered 2018-01-18: 100 mg via ORAL
  Filled 2018-01-18: qty 1

## 2018-01-18 MED ORDER — ONDANSETRON 4 MG PO TBDP: 4.0000 mg | ORAL_TABLET | Freq: Three times a day (TID) | ORAL | 0 refills | Status: DC | PRN

## 2018-01-18 MED ORDER — KETOROLAC TROMETHAMINE 60 MG/2ML IM SOLN
60.0000 mg | Freq: Once | INTRAMUSCULAR | Status: AC
  Administered 2018-01-18: 60 mg via INTRAMUSCULAR
  Filled 2018-01-18: qty 2

## 2018-01-18 MED ORDER — DOXYCYCLINE HYCLATE 100 MG PO CAPS: 100.0000 mg | ORAL_CAPSULE | Freq: Two times a day (BID) | ORAL | 0 refills | Status: AC

## 2018-01-18 MED ORDER — METRONIDAZOLE 500 MG PO TABS: 500.0000 mg | ORAL_TABLET | Freq: Two times a day (BID) | ORAL | 0 refills | Status: AC

## 2018-01-18 NOTE — ED Notes (Signed)
ED Provider at bedside. 

## 2018-01-18 NOTE — Discharge Instructions (Addendum)
Your lab work today showed a mild elevation in your liver enzymes - this has been present on your labs for quite some time, btu it's important to mention it to your primary doctor to make sure that it is monitored and treated.  Avoid tylenol and alcohol.  Take the antibiotics as prescribed.

## 2018-01-18 NOTE — ED Provider Notes (Signed)
[facility] EMERGENCY DEPARTMENT Provider Note   CSN: [id number] Arrival date & time: 01/17/18  2204     History   Chief Complaint Chief Complaint  Patient presents with  . Abdominal Pain    HPI Deanna Castaneda is a 27 y.o. female.  HPI 27 year old female here with lower abdominal pain.  Patient states that for the last several days, she has had sharp, constant, lower abdominal pain.  The pain is midline.  Pain is constant.  It worsens with movement and palpation.  No alleviating factors.  She does state that the pain got significantly worse with intercourse.  She is sexually active with one female partner and they do not regularly use protection.  No vaginal bleeding.  No vaginal discharge.  Denies any upper abdominal pain.  No nausea or vomiting.  Her appetite is been normal.  No recent sick contacts.  Past Medical History:  Diagnosis Date  . Asthma   . Sickle cell trait (HCC)     There are no active problems to display for this patient.   Past Surgical History:  Procedure Laterality Date  . CHOLECYSTECTOMY       OB History   None      Home Medications    Prior to Admission medications   Medication Sig Start Date End Date Taking? Authorizing Provider  albuterol (PROVENTIL HFA;VENTOLIN HFA) 108 (90 Base) MCG/ACT inhaler Inhale 1-2 puffs into the lungs every 6 (six) hours as needed for wheezing or shortness of breath.   Yes [provider]  diclofenac sodium (VOLTAREN) 1 % GEL Apply 4 g topically 4 (four) times daily. Patient not taking: Reported on 08/03/2017 11/25/16   Harolyn Rutherford C, PA-C  doxycycline (VIBRAMYCIN) 100 MG capsule Take 1 capsule (100 mg total) by mouth 2 (two) times daily for 14 days. 01/18/18 02/01/18  Shaune Pollack, MD  ibuprofen (ADVIL,MOTRIN) 800 MG tablet Take 1 tablet (800 mg total) by mouth every 8 (eight) hours as needed. Patient not taking: Reported on 08/03/2017 02/15/17   Ward, Chase Picket, PA-C  lidocaine (LIDODERM) 5  % Place 1 patch onto the skin daily. Remove & Discard patch within 12 hours or as directed by MD Patient not taking: Reported on 08/03/2017 11/25/16   Joy, Hillard Danker, PA-C  methocarbamol (ROBAXIN) 500 MG tablet Take 1 tablet (500 mg total) by mouth 2 (two) times daily as needed for muscle spasms. Patient not taking: Reported on 08/03/2017 02/15/17   Ward, Chase Picket, PA-C  metroNIDAZOLE (FLAGYL) 500 MG tablet Take 1 tablet (500 mg total) by mouth 2 (two) times daily for 7 days. 01/18/18 01/25/18  Shaune Pollack, MD  ondansetron (ZOFRAN) 4 MG tablet Take 1 tablet (4 mg total) by mouth every 6 (six) hours. Patient not taking: Reported on 08/03/2017 08/20/16   Janne Napoleon, NP    Family History History reviewed. No pertinent family history.  Social History Social History   Tobacco Use  . Smoking status: Never Smoker  . Smokeless tobacco: Never Used  Substance Use Topics  . Alcohol use: No  . Drug use: No     Allergies   Patient has no known allergies.   Review of Systems Review of Systems  Constitutional: Negative for chills, fatigue and fever.  HENT: Negative for congestion and rhinorrhea.   Eyes: Negative for visual disturbance.  Respiratory: Negative for cough, shortness of breath and wheezing.   Cardiovascular: Negative for chest pain and leg swelling.  Gastrointestinal: Positive for abdominal pain.  Negative for diarrhea, nausea and vomiting.  Genitourinary: Positive for pelvic pain. Negative for dysuria and flank pain.  Musculoskeletal: Negative for neck pain and neck stiffness.  Skin: Negative for rash and wound.  Allergic/Immunologic: Negative for immunocompromised state.  Neurological: Negative for syncope, weakness and headaches.  All other systems reviewed and are negative.    Physical Exam Updated Vital Signs BP 97/63   Pulse 76   Temp 98.7 F (37.1 C) (Oral)   Resp 16   Ht 5\' 2"  (1.575 m)   Wt 93 kg (205 lb)   LMP 12/29/2017   SpO2 100%   BMI 37.49 kg/m    Physical Exam  Constitutional: She is oriented to person, place, and time. She appears well-developed and well-nourished. No distress.  HENT:  Head: Normocephalic and atraumatic.  Eyes: Conjunctivae are normal.  Neck: Neck supple.  Cardiovascular: Normal rate, regular rhythm and normal heart sounds. Exam reveals no friction rub.  No murmur heard. Pulmonary/Chest: Effort normal and breath sounds normal. No respiratory distress. She has no wheezes. She has no rales.  Abdominal: She exhibits no distension. There is tenderness in the suprapubic area. There is no rigidity, no rebound and no guarding.  Genitourinary:  Genitourinary Comments: Moderate volume yellow-green vaginal discharge.  Cervix with moderate friability and tenderness to manipulation.  No adnexal pain or fullness.  Positive CMT.  Musculoskeletal: She exhibits no edema.  Neurological: She is alert and oriented to person, place, and time. She exhibits normal muscle tone.  Skin: Skin is warm. Capillary refill takes less than 2 seconds.  Psychiatric: She has a normal mood and affect.  Nursing note and vitals reviewed.    ED Treatments / Results  Labs (all labs ordered are listed, but only abnormal results are displayed) Labs Reviewed  WET PREP, GENITAL - Abnormal; Notable for the following components:      Result Value   Clue Cells Wet Prep HPF POC PRESENT (*)    WBC, Wet Prep HPF POC MANY (*)    All other components within normal limits  COMPREHENSIVE METABOLIC PANEL - Abnormal; Notable for the following components:   Albumin 3.3 (*)    AST 59 (*)    ALT 80 (*)    All other components within normal limits  LIPASE, BLOOD  CBC  URINALYSIS, ROUTINE W REFLEX MICROSCOPIC  HIV ANTIBODY (ROUTINE TESTING)  I-STAT BETA HCG BLOOD, ED ([facility], [facility], [facility] ONLY)  GC/CHLAMYDIA PROBE AMP ([place]) NOT AT [facility]    EKG None  Radiology No results found.  Procedures Procedures (including critical care time)  Medications  Ordered in ED Medications  ketorolac (TORADOL) injection 60 mg (60 mg Intramuscular Given 01/18/18 0134)  cefTRIAXone (ROCEPHIN) injection 250 mg (250 mg Intramuscular Given 01/18/18 0317)  doxycycline (VIBRA-TABS) tablet 100 mg (100 mg Oral Given 01/18/18 0317)  lidocaine (PF) (XYLOCAINE) 1 % injection (2 mLs  Given 01/18/18 0317)     Initial Impression / Assessment and Plan / ED Course  I have reviewed the triage vital signs and the nursing notes.  Pertinent labs & imaging results that were available during my care of the patient were reviewed by me and considered in my medical decision making (see chart for details).     27 year old female here with lower abdominal pain.  Exam and history is concerning for PID.  Patient has no adnexal pain, no colicky pain, and I do not suspect torsion or TOA.  She is afebrile and hemodynamically stable.  Of note, she  does have chronic, mild elevation in her LFTs.  I suspect this may be due to fatty liver disease but will refer her to her primary care physician.  Otherwise, will treat her for PID, advised safe sex, and discharge.  Final Clinical Impressions(s) / ED Diagnoses   Final diagnoses:  Suprapubic pain  PID (acute pelvic inflammatory disease)  Elevated LFTs    ED Discharge Orders        Ordered    doxycycline (VIBRAMYCIN) 100 MG capsule  2 times daily     01/18/18 0419    metroNIDAZOLE (FLAGYL) 500 MG tablet  2 times daily     01/18/18 0419       Shaune PollackIsaacs, Davie Claud, MD 01/18/18 [id number]

## 2018-01-19 LAB — GC/CHLAMYDIA PROBE AMP (~~LOC~~) NOT AT ARMC
Chlamydia: NEGATIVE
Neisseria Gonorrhea: NEGATIVE

## 2018-01-19 LAB — HIV ANTIBODY (ROUTINE TESTING W REFLEX): HIV SCREEN 4TH GENERATION: NONREACTIVE

## 2018-01-22 ENCOUNTER — Emergency Department (HOSPITAL_COMMUNITY)
Admission: EM | Admit: 2018-01-22 | Discharge: 2018-01-23 | Disposition: A | Discharge status: Home / Self Care | Payer: Self-pay | Attending: Emergency Medicine | Admitting: Emergency Medicine

## 2018-01-22 ENCOUNTER — Encounter (HOSPITAL_COMMUNITY): Payer: Self-pay

## 2018-01-22 DIAGNOSIS — R112 Nausea with vomiting, unspecified: Secondary | ICD-10-CM | POA: Insufficient documentation

## 2018-01-22 DIAGNOSIS — J45909 Unspecified asthma, uncomplicated: Secondary | ICD-10-CM | POA: Insufficient documentation

## 2018-01-22 DIAGNOSIS — R197 Diarrhea, unspecified: Secondary | ICD-10-CM

## 2018-01-22 DIAGNOSIS — R0602 Shortness of breath: Secondary | ICD-10-CM

## 2018-01-22 DIAGNOSIS — Z79899 Other long term (current) drug therapy: Secondary | ICD-10-CM | POA: Insufficient documentation

## 2018-01-22 DIAGNOSIS — R519 Headache, unspecified: Secondary | ICD-10-CM

## 2018-01-22 DIAGNOSIS — R51 Headache: Secondary | ICD-10-CM

## 2018-01-22 DIAGNOSIS — G43809 Other migraine, not intractable, without status migrainosus: Secondary | ICD-10-CM | POA: Insufficient documentation

## 2018-01-22 MED ORDER — ONDANSETRON 4 MG PO TBDP
8.0000 mg | ORAL_TABLET | Freq: Once | ORAL | Status: AC
  Administered 2018-01-23: 8 mg via ORAL
  Filled 2018-01-22 (×2): qty 2

## 2018-01-22 MED ORDER — GI COCKTAIL ~~LOC~~
30.0000 mL | Freq: Once | ORAL | Status: AC
  Administered 2018-01-23: 30 mL via ORAL
  Filled 2018-01-22: qty 30

## 2018-01-22 NOTE — ED Triage Notes (Signed)
Pt reports that she has been having SOB today, hx of asthma, dry cough, unrelieved by at home inhaler, nausea, bodyaches and headaches that has been going on for a week, seen here last week for the same.

## 2018-01-22 NOTE — ED Provider Notes (Signed)
[facility] EMERGENCY DEPARTMENT Provider Note   CSN: [id number] Arrival date & time: 01/22/18  2212     History   Chief Complaint Chief Complaint  Patient presents with  . Shortness of Breath  . Headache    HPI Deanna Castaneda is a 27 y.o. female.  HPI Deanna Castaneda is a 27 y.o. female with history of asthma, presents to emergency department complaining of shortness of breath, chest pain, abdominal pain, nausea, vomiting, diarrhea.  She states symptoms for 5 days.  Was seen in the emergency department at time of onset, had lab work, pelvic exam done, was started on  doxycycline, Zofran, Vicodin.  She states she is taking medications but continues to throw up.  She continues to cough, states productive sputum that is white in color.  She continues to have nausea and vomiting.  She has generalized abdominal pain that is not improving.  She states she has had abdominal issues for "a very long time."  She states she had a gallbladder out but it did not fix the problem.  She has not followed up with a specialist.  Denies any fever.  She states she is having chills.  She states she feels generally weak.  She has been also using her inhaler "too many times" with no relief of her shortness of breath. Past Medical History:  Diagnosis Date  . Asthma   . Sickle cell trait (HCC)     There are no active problems to display for this patient.   Past Surgical History:  Procedure Laterality Date  . CHOLECYSTECTOMY       OB History   None      Home Medications    Prior to Admission medications   Medication Sig Start Date End Date Taking? Authorizing Provider  albuterol (PROVENTIL HFA;VENTOLIN HFA) 108 (90 Base) MCG/ACT inhaler Inhale 1-2 puffs into the lungs every 6 (six) hours as needed for wheezing or shortness of breath.    [provider]  diclofenac sodium (VOLTAREN) 1 % GEL Apply 4 g topically 4 (four) times daily. Patient not taking: Reported on  08/03/2017 11/25/16   Harolyn Rutherford C, PA-C  doxycycline (VIBRAMYCIN) 100 MG capsule Take 1 capsule (100 mg total) by mouth 2 (two) times daily for 14 days. 01/18/18 02/01/18  Shaune Pollack, MD  HYDROcodone-acetaminophen (NORCO/VICODIN) 5-325 MG tablet Take 1-2 tablets by mouth every 6 (six) hours as needed for moderate pain or severe pain. 01/18/18   Shaune Pollack, MD  ibuprofen (ADVIL,MOTRIN) 800 MG tablet Take 1 tablet (800 mg total) by mouth every 8 (eight) hours as needed. Patient not taking: Reported on 08/03/2017 02/15/17   Ward, Chase Picket, PA-C  lidocaine (LIDODERM) 5 % Place 1 patch onto the skin daily. Remove & Discard patch within 12 hours or as directed by MD Patient not taking: Reported on 08/03/2017 11/25/16   Joy, Hillard Danker, PA-C  methocarbamol (ROBAXIN) 500 MG tablet Take 1 tablet (500 mg total) by mouth 2 (two) times daily as needed for muscle spasms. Patient not taking: Reported on 08/03/2017 02/15/17   Ward, Chase Picket, PA-C  metroNIDAZOLE (FLAGYL) 500 MG tablet Take 1 tablet (500 mg total) by mouth 2 (two) times daily for 7 days. 01/18/18 01/25/18  Shaune Pollack, MD  ondansetron (ZOFRAN ODT) 4 MG disintegrating tablet Take 1 tablet (4 mg total) by mouth every 8 (eight) hours as needed for nausea or vomiting. 01/18/18   Shaune Pollack, MD  ondansetron (ZOFRAN) 4 MG tablet Take 1  tablet (4 mg total) by mouth every 6 (six) hours. Patient not taking: Reported on 08/03/2017 08/20/16   Janne NapoleonNeese, Hope M, NP    Family History No family history on file.  Social History Social History   Tobacco Use  . Smoking status: Never Smoker  . Smokeless tobacco: Never Used  Substance Use Topics  . Alcohol use: No  . Drug use: No     Allergies   Patient has no known allergies.   Review of Systems Review of Systems  Constitutional: Positive for chills and fatigue. Negative for fever.  HENT: Positive for congestion.   Respiratory: Positive for cough and shortness of breath. Negative for chest  tightness.   Cardiovascular: Positive for chest pain. Negative for palpitations and leg swelling.  Gastrointestinal: Positive for abdominal pain and diarrhea. Negative for nausea and vomiting.  Genitourinary: Negative for dysuria, flank pain, pelvic pain, vaginal bleeding, vaginal discharge and vaginal pain.  Musculoskeletal: Negative for arthralgias, myalgias, neck pain and neck stiffness.  Skin: Negative for rash.  Neurological: Positive for weakness and headaches. Negative for dizziness.  All other systems reviewed and are negative.    Physical Exam Updated Vital Signs BP 108/79   Pulse 78   Temp 98 F (36.7 C) (Oral)   Resp 18   LMP 12/29/2017   SpO2 100%   Physical Exam  Constitutional: She appears well-developed and well-nourished. No distress.  HENT:  Head: Normocephalic.  Eyes: Conjunctivae are normal.  Neck: Neck supple.  Cardiovascular: Normal rate, regular rhythm and normal heart sounds.  Pulmonary/Chest: Effort normal and breath sounds normal. No respiratory distress. She has no wheezes. She has no rales.  Abdominal: Soft. Bowel sounds are normal. She exhibits no distension. There is tenderness. There is no rebound.  Diffuse tenderness  Musculoskeletal: She exhibits no edema.  Neurological: She is alert.  Skin: Skin is warm and dry.  Psychiatric: She has a normal mood and affect. Her behavior is normal.  Nursing note and vitals reviewed.    ED Treatments / Results  Labs (all labs ordered are listed, but only abnormal results are displayed) Labs Reviewed  COMPREHENSIVE METABOLIC PANEL - Abnormal; Notable for the following components:      Result Value   CO2 21 (*)    Total Protein 8.3 (*)    AST 47 (*)    ALT 83 (*)    All other components within normal limits  CBC WITH DIFFERENTIAL/PLATELET  LIPASE, BLOOD  URINALYSIS, ROUTINE W REFLEX MICROSCOPIC  I-STAT BETA HCG BLOOD, ED ([facility], [facility], [facility] ONLY)    EKG EKG Interpretation  Date/Time:  Sunday January 22 2018 22:19:14 EDT Ventricular Rate:  72 PR Interval:  124 QRS Duration: 78 QT Interval:  380 QTC Calculation: 416 R Axis:   27 Text Interpretation:  Normal sinus rhythm Nonspecific T wave abnormality Abnormal ECG No significant change was found Confirmed by Azalia Bilisampos, Kevin ([id number]) on 01/23/2018 4:40:05 AM   Radiology Dg Chest 2 View  Result Date: 01/23/2018 CLINICAL DATA:  27 year old female with cough and shortness of breath. EXAM: CHEST - 2 VIEW COMPARISON:  None. FINDINGS: The heart size and mediastinal contours are within normal limits. Both lungs are clear. The visualized skeletal structures are unremarkable. IMPRESSION: No active cardiopulmonary disease. Electronically Signed   By: Elgie CollardArash  Radparvar M.D.   On: 01/23/2018 01:05    Procedures Procedures (including critical care time)  Medications Ordered in ED Medications  ondansetron (ZOFRAN-ODT) disintegrating tablet 8 mg (has no administration in time  range)  gi cocktail (Maalox,Lidocaine,Donnatal) (has no administration in time range)     Initial Impression / Assessment and Plan / ED Course  I have reviewed the triage vital signs and the nursing notes.  Pertinent labs & imaging results that were available during my care of the patient were reviewed by me and considered in my medical decision making (see chart for details).     She did no acute distress.  Here for multiple complaints, including headache, nausea, vomiting, abdominal pain, chest pain, shortness of breath, cough, diarrhea.  Symptoms most consistent with a viral syndrome.  Had blood work done 4 days ago when she was seen here for the same, all unremarkable.  Pregnancy test was negative.  Thought to have PID, discharged with doxycycline.  She states her symptoms are not improving.  Her GC and Chlamydia cultures came back negative.  I will recheck labs given that she now she is been vomiting for 5 days.  Will give Zofran and GI cocktail.  Will reassess.   1:53  AM Normal labs, not pregnant.  Urinalysis was done just few days ago was unremarkable.  Chest x-ray is negative.  Vital signs are all within normal.  I have no concern about a PE, or ACS, with no risk factors.  She is tolerating oral fluids in emergency department.  I suspect she may have a viral syndrome versus acid reflux which could give her all of the symptoms that she is having.  I will start her on Pepcid and Carafate.  We discussed bland diet.  Abdomen is soft, no guarding, no rigidity, do not think she needs any imaging on emergent basis.  We discussed close outpatient follow-up.  She is stable for discharge home from emergency department at this time, she will need further work-up if she is not improving.  Patient voiced understanding.  Vitals:   01/22/18 2345 01/22/18 2346 01/23/18 0115 01/23/18 0130  BP: 108/79  102/80 120/83  Pulse:  78 70 88  Resp:  18 15 (!) 22  Temp:      TempSrc:      SpO2:  100% 100% 99%     Final Clinical Impressions(s) / ED Diagnoses   Final diagnoses:  Nausea vomiting and diarrhea  Nonintractable headache, unspecified chronicity pattern, unspecified headache type  Shortness of breath    ED Discharge Orders        Ordered    famotidine (PEPCID) 20 MG tablet  2 times daily     01/23/18 0143    sucralfate (CARAFATE) 1 g tablet  3 times daily with meals & bedtime     01/23/18 0143       Jaynie Crumble, PA-C 01/23/18 0440    Nira Conn, MD 01/23/18 [phone]

## 2018-01-23 ENCOUNTER — Emergency Department (HOSPITAL_COMMUNITY): Payer: Self-pay

## 2018-01-23 LAB — CBC WITH DIFFERENTIAL/PLATELET
Abs Immature Granulocytes: 0 10*3/uL (ref 0.0–0.1)
BASOS ABS: 0 10*3/uL (ref 0.0–0.1)
Basophils Relative: 0 %
EOS ABS: 0.1 10*3/uL (ref 0.0–0.7)
EOS PCT: 1 %
HCT: 40.5 % (ref 36.0–46.0)
HEMOGLOBIN: 13.9 g/dL (ref 12.0–15.0)
IMMATURE GRANULOCYTES: 0 %
LYMPHS ABS: 3 10*3/uL (ref 0.7–4.0)
LYMPHS PCT: 42 %
MCH: 28 pg (ref 26.0–34.0)
MCHC: 34.3 g/dL (ref 30.0–36.0)
MCV: 81.7 fL (ref 78.0–100.0)
Monocytes Absolute: 0.6 10*3/uL (ref 0.1–1.0)
Monocytes Relative: 9 %
NEUTROS PCT: 47 %
Neutro Abs: 3.3 10*3/uL (ref 1.7–7.7)
Platelets: 313 10*3/uL (ref 150–400)
RBC: 4.96 MIL/uL (ref 3.87–5.11)
RDW: 13.7 % (ref 11.5–15.5)
WBC: 7 10*3/uL (ref 4.0–10.5)

## 2018-01-23 LAB — COMPREHENSIVE METABOLIC PANEL
ALT: 83 U/L — ABNORMAL HIGH (ref 14–54)
ANION GAP: 10 (ref 5–15)
AST: 47 U/L — ABNORMAL HIGH (ref 15–41)
Albumin: 3.7 g/dL (ref 3.5–5.0)
Alkaline Phosphatase: 81 U/L (ref 38–126)
BILIRUBIN TOTAL: 0.4 mg/dL (ref 0.3–1.2)
BUN: 11 mg/dL (ref 6–20)
CO2: 21 mmol/L — ABNORMAL LOW (ref 22–32)
Calcium: 9.4 mg/dL (ref 8.9–10.3)
Chloride: 105 mmol/L (ref 101–111)
Creatinine, Ser: 0.75 mg/dL (ref 0.44–1.00)
Glucose, Bld: 85 mg/dL (ref 65–99)
POTASSIUM: 3.7 mmol/L (ref 3.5–5.1)
Sodium: 136 mmol/L (ref 135–145)
TOTAL PROTEIN: 8.3 g/dL — AB (ref 6.5–8.1)

## 2018-01-23 LAB — I-STAT BETA HCG BLOOD, ED (MC, WL, AP ONLY)

## 2018-01-23 LAB — LIPASE, BLOOD: LIPASE: 31 U/L (ref 11–51)

## 2018-01-23 MED ORDER — FAMOTIDINE 20 MG PO TABS: 20.0000 mg | ORAL_TABLET | Freq: Two times a day (BID) | ORAL | 0 refills | Status: DC

## 2018-01-23 MED ORDER — ACETAMINOPHEN 325 MG PO TABS
650.0000 mg | ORAL_TABLET | Freq: Once | ORAL | Status: AC
  Administered 2018-01-23: 650 mg via ORAL
  Filled 2018-01-23: qty 2

## 2018-01-23 MED ORDER — SUCRALFATE 1 G PO TABS: 1.0000 g | ORAL_TABLET | Freq: Three times a day (TID) | ORAL | 0 refills | Status: DC

## 2018-01-23 NOTE — ED Notes (Signed)
Patient reminded of need for urine specimen. 

## 2018-01-23 NOTE — Discharge Instructions (Addendum)
Start taking Pepcid and Carafate as prescribed.  Bland food diet.  Please find a primary care doctor follow-up for further evaluation.  Return if worsening symptoms.

## 2018-03-30 ENCOUNTER — Encounter (HOSPITAL_COMMUNITY): Payer: Self-pay | Admitting: Emergency Medicine

## 2018-03-30 ENCOUNTER — Emergency Department (HOSPITAL_COMMUNITY)
Admission: EM | Admit: 2018-03-30 | Discharge: 2018-03-30 | Disposition: A | Discharge status: Home / Self Care | Payer: Medicaid Other | Attending: Emergency Medicine | Admitting: Emergency Medicine

## 2018-03-30 ENCOUNTER — Other Ambulatory Visit: Payer: Self-pay

## 2018-03-30 DIAGNOSIS — Z79899 Other long term (current) drug therapy: Secondary | ICD-10-CM | POA: Insufficient documentation

## 2018-03-30 DIAGNOSIS — J45909 Unspecified asthma, uncomplicated: Secondary | ICD-10-CM | POA: Insufficient documentation

## 2018-03-30 DIAGNOSIS — J029 Acute pharyngitis, unspecified: Secondary | ICD-10-CM | POA: Insufficient documentation

## 2018-03-30 LAB — GROUP A STREP BY PCR: GROUP A STREP BY PCR: NOT DETECTED

## 2018-03-30 NOTE — ED Provider Notes (Signed)
[facility] Provider Note   CSN: [id number] Arrival date & time: 03/30/18  1846     History   Chief Complaint Chief Complaint  Patient presents with  . Sore Throat  . Otalgia    HPI Deanna Castaneda is a 27 y.o. female.  27 y/o female with a PMH of Asthma and sickle cell trait resents to the ED with a chief complaint of right ear pain x1 day.  Scribes the pain is an aching and swelling along her right ear, behind her ear, right tonsilar region.  Reports the pain is worse with palpation and eating.  Patient has tried applying some sweet oil to her face but states it has not helped.  Not tried any pain medication for pain relief.  Denies any hearing loss, muffled noises, drainage, fever or URI symptoms.     Past Medical History:  Diagnosis Date  . Asthma   . Sickle cell trait (HCC)     There are no active problems to display for this patient.   Past Surgical History:  Procedure Laterality Date  . CHOLECYSTECTOMY       OB History   None      Home Medications    Prior to Admission medications   Medication Sig Start Date End Date Taking? Authorizing Provider  albuterol (PROVENTIL HFA;VENTOLIN HFA) 108 (90 Base) MCG/ACT inhaler Inhale 1-2 puffs into the lungs every 6 (six) hours as needed for wheezing or shortness of breath.    [provider]  diclofenac sodium (VOLTAREN) 1 % GEL Apply 4 g topically 4 (four) times daily. Patient not taking: Reported on 08/03/2017 11/25/16   Harolyn RutherfordJoy, Shawn C, PA-C  famotidine (PEPCID) 20 MG tablet Take 1 tablet (20 mg total) by mouth 2 (two) times daily. 01/23/18   Kirichenko, Lemont Fillersatyana, PA-C  HYDROcodone-acetaminophen (NORCO/VICODIN) 5-325 MG tablet Take 1-2 tablets by mouth every 6 (six) hours as needed for moderate pain or severe pain. 01/18/18   Shaune PollackIsaacs, Cameron, MD  ibuprofen (ADVIL,MOTRIN) 800 MG tablet Take 1 tablet (800 mg total) by mouth every 8 (eight) hours as needed. Patient not taking:  Reported on 08/03/2017 02/15/17   Ward, Chase PicketJaime Pilcher, PA-C  lidocaine (LIDODERM) 5 % Place 1 patch onto the skin daily. Remove & Discard patch within 12 hours or as directed by MD Patient not taking: Reported on 08/03/2017 11/25/16   Joy, Hillard DankerShawn C, PA-C  methocarbamol (ROBAXIN) 500 MG tablet Take 1 tablet (500 mg total) by mouth 2 (two) times daily as needed for muscle spasms. Patient not taking: Reported on 08/03/2017 02/15/17   Ward, Chase PicketJaime Pilcher, PA-C  ondansetron (ZOFRAN ODT) 4 MG disintegrating tablet Take 1 tablet (4 mg total) by mouth every 8 (eight) hours as needed for nausea or vomiting. 01/18/18   Shaune PollackIsaacs, Cameron, MD  ondansetron (ZOFRAN) 4 MG tablet Take 1 tablet (4 mg total) by mouth every 6 (six) hours. Patient not taking: Reported on 08/03/2017 08/20/16   Janne NapoleonNeese, Hope M, NP  sucralfate (CARAFATE) 1 g tablet Take 1 tablet (1 g total) by mouth 4 (four) times daily -  with meals and at bedtime. 01/23/18   Jaynie CrumbleKirichenko, Tatyana, PA-C    Family History No family history on file.  Social History Social History   Tobacco Use  . Smoking status: Never Smoker  . Smokeless tobacco: Never Used  Substance Use Topics  . Alcohol use: No  . Drug use: No     Allergies   Patient has no known  allergies.   Review of Systems Review of Systems  Constitutional: Negative for fever.  HENT: Positive for ear pain and facial swelling. Negative for ear discharge, rhinorrhea and sinus pain.   Eyes: Negative for itching.  Respiratory: Negative for shortness of breath.   Cardiovascular: Negative for chest pain.  All other systems reviewed and are negative.    Physical Exam Updated Vital Signs BP 120/77   Pulse 70   Temp 97.8 F (36.6 C) (Oral)   Resp 18   SpO2 99%   Physical Exam  Constitutional: She is oriented to person, place, and time. She appears well-developed and well-nourished.  HENT:  Head: Normocephalic and atraumatic.  Mouth/Throat: Uvula is midline and oropharynx is clear and moist.    Neck: Normal range of motion. No tracheal deviation present.  Cardiovascular: Normal rate.  Pulmonary/Chest: Effort normal and breath sounds normal.  Abdominal: Soft. Bowel sounds are normal. There is no tenderness.  Lymphadenopathy:       Head (right side): Tonsillar adenopathy present.       Head (left side): Tonsillar adenopathy present.  Significant right tonsillar lymphadenopathy.  Pain with palpation.  Neurological: She is alert and oriented to person, place, and time.  Skin: Skin is warm and dry. Capillary refill takes less than 2 seconds.  Nursing note and vitals reviewed.    ED Treatments / Results  Labs (all labs ordered are listed, but only abnormal results are displayed) Labs Reviewed  GROUP A STREP BY PCR    EKG None  Radiology No results found.  Procedures Procedures (including critical care time)  Medications Ordered in ED Medications - No data to display   Initial Impression / Assessment and Plan / ED Course  I have reviewed the triage vital signs and the nursing notes.  Pertinent labs & imaging results that were available during my care of the patient were reviewed by me and considered in my medical decision making (see chart for details).     Presents with throat, with eating x1 day.  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  Rapid strep was negative.  There is significant cervical lymphadenopathy on the right side where patient is complaining of pain there is no right maxillary tenderness or pressure.  On examination of the ear there is no drainage, discharge there is pain with pulling ear but ear canal looks clear, TM visualized.  At this time I advised patient to symptomatically treat with over-the-counter medications, she is advised if she experiences a fever or worsening symptoms she may return for reevaluation.  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  I will provide this for patient.  Return precautions  provided.  Final Clinical Impressions(s) / ED Diagnoses   Final diagnoses:  Sore throat    ED Discharge Orders    None       Claude Manges, PA-C 03/30/18 2050    Mancel Bale, MD 03/31/18 [phone]

## 2018-03-30 NOTE — Discharge Instructions (Addendum)
Please take Tylenol if you experience any fever.  You should also hydrate with water, Gatorade.  If you experience any changes in voice, difficulty swallowing, chest pain or shortness of breath you may return to the ED for reevaluation.

## 2018-03-30 NOTE — ED Triage Notes (Signed)
Pt reports ear pain and a sore throat that started yesterday. Pt states she has not taken any medication to feel better.

## 2018-04-11 ENCOUNTER — Emergency Department (HOSPITAL_COMMUNITY)
Admission: EM | Admit: 2018-04-11 | Discharge: 2018-04-12 | Disposition: A | Discharge status: Home / Self Care | Payer: Medicaid Other | Attending: Emergency Medicine | Admitting: Emergency Medicine

## 2018-04-11 ENCOUNTER — Encounter (HOSPITAL_COMMUNITY): Payer: Self-pay | Admitting: Emergency Medicine

## 2018-04-11 DIAGNOSIS — J45909 Unspecified asthma, uncomplicated: Secondary | ICD-10-CM | POA: Insufficient documentation

## 2018-04-11 DIAGNOSIS — H9201 Otalgia, right ear: Secondary | ICD-10-CM | POA: Insufficient documentation

## 2018-04-11 DIAGNOSIS — Z79899 Other long term (current) drug therapy: Secondary | ICD-10-CM | POA: Insufficient documentation

## 2018-04-11 DIAGNOSIS — J0100 Acute maxillary sinusitis, unspecified: Secondary | ICD-10-CM | POA: Insufficient documentation

## 2018-04-11 NOTE — ED Triage Notes (Signed)
Pt reports R ear pain X2 weeks. Pt states she was seen here for same 03/30/18 and told to come back if her pain has not improved.

## 2018-04-12 MED ORDER — IBUPROFEN 400 MG PO TABS
600.0000 mg | ORAL_TABLET | Freq: Once | ORAL | Status: AC
  Administered 2018-04-12: 600 mg via ORAL
  Filled 2018-04-12: qty 1

## 2018-04-12 MED ORDER — AMOXICILLIN-POT CLAVULANATE 875-125 MG PO TABS: 1.0000 | ORAL_TABLET | Freq: Two times a day (BID) | ORAL | 0 refills | Status: DC

## 2018-04-12 NOTE — ED Provider Notes (Signed)
[facility] EMERGENCY DEPARTMENT Provider Note   CSN: [id number] Arrival date & time: 04/11/18  2216     History   Chief Complaint Chief Complaint  Patient presents with  . Otalgia    HPI Deanna Castaneda is a 27 y.o. female with a history of asthma presents emergency department today for right ear pain and sinus pressure.  Patient reports that 2 weeks ago she started having nasal congestion, rhinorrhea, ear pain, sore throat.  She was seen here with negative strep test was discharged home on conservative therapy.  She notes that her sore throat and cough has improved however she has had continued sinus pressure as well as right ear pain.  She denies any pain in the area of her mastoid.  She notes she has been taking Tylenol for pain without relief.  She denies any headache, neck stiffness, rash, visual changes, sore throat, difficulty swallowing, shortness of breath, fever or history of immunocompromise.  Patient notes that nothing makes her symptoms better or worse.  HPI  Past Medical History:  Diagnosis Date  . Asthma   . Sickle cell trait (HCC)     There are no active problems to display for this patient.   Past Surgical History:  Procedure Laterality Date  . CHOLECYSTECTOMY       OB History   None      Home Medications    Prior to Admission medications   Medication Sig Start Date End Date Taking? Authorizing Provider  albuterol (PROVENTIL HFA;VENTOLIN HFA) 108 (90 Base) MCG/ACT inhaler Inhale 1-2 puffs into the lungs every 6 (six) hours as needed for wheezing or shortness of breath.    [provider]  diclofenac sodium (VOLTAREN) 1 % GEL Apply 4 g topically 4 (four) times daily. Patient not taking: Reported on 08/03/2017 11/25/16   Harolyn Rutherford C, PA-C  famotidine (PEPCID) 20 MG tablet Take 1 tablet (20 mg total) by mouth 2 (two) times daily. 01/23/18   Kirichenko, Lemont Fillers, PA-C  HYDROcodone-acetaminophen (NORCO/VICODIN) 5-325 MG tablet Take  1-2 tablets by mouth every 6 (six) hours as needed for moderate pain or severe pain. 01/18/18   Shaune Pollack, MD  ibuprofen (ADVIL,MOTRIN) 800 MG tablet Take 1 tablet (800 mg total) by mouth every 8 (eight) hours as needed. Patient not taking: Reported on 08/03/2017 02/15/17   Ward, Chase Picket, PA-C  lidocaine (LIDODERM) 5 % Place 1 patch onto the skin daily. Remove & Discard patch within 12 hours or as directed by MD Patient not taking: Reported on 08/03/2017 11/25/16   Joy, Hillard Danker, PA-C  methocarbamol (ROBAXIN) 500 MG tablet Take 1 tablet (500 mg total) by mouth 2 (two) times daily as needed for muscle spasms. Patient not taking: Reported on 08/03/2017 02/15/17   Ward, Chase Picket, PA-C  ondansetron (ZOFRAN ODT) 4 MG disintegrating tablet Take 1 tablet (4 mg total) by mouth every 8 (eight) hours as needed for nausea or vomiting. 01/18/18   Shaune Pollack, MD  ondansetron (ZOFRAN) 4 MG tablet Take 1 tablet (4 mg total) by mouth every 6 (six) hours. Patient not taking: Reported on 08/03/2017 08/20/16   Janne Napoleon, NP  sucralfate (CARAFATE) 1 g tablet Take 1 tablet (1 g total) by mouth 4 (four) times daily -  with meals and at bedtime. 01/23/18   Jaynie Crumble, PA-C    Family History No family history on file.  Social History Social History   Tobacco Use  . Smoking status: Never Smoker  .  Smokeless tobacco: Never Used  Substance Use Topics  . Alcohol use: No  . Drug use: No     Allergies   Patient has no known allergies.   Review of Systems Review of Systems  All other systems reviewed and are negative.    Physical Exam Updated Vital Signs BP 112/81 (BP Location: Right Arm)   Pulse 82   Temp 99 F (37.2 C) (Oral)   Resp 18   Ht 5\' 2"  (1.575 m)   Wt 97.5 kg   SpO2 100%   BMI 39.32 kg/m   Physical Exam  Constitutional: She appears well-developed and well-nourished.  HENT:  Head: Normocephalic and atraumatic.  Right Ear: Hearing, external ear and ear canal  normal. No mastoid tenderness. Tympanic membrane is erythematous and bulging.  Left Ear: Hearing, tympanic membrane, external ear and ear canal normal. No mastoid tenderness.  Nose: Mucosal edema present. Right sinus exhibits maxillary sinus tenderness. Left sinus exhibits maxillary sinus tenderness.  Mouth/Throat: Uvula is midline, oropharynx is clear and moist and mucous membranes are normal. No tonsillar exudate.  No mastoid erythema, edema or tenderness.  No obliteration of postauricular crease. The patient has normal phonation and is in control of secretions. No stridor.  Midline uvula without edema. Soft palate rises symmetrically.  No tonsillar erythema or exudates. No PTA. Tongue protrusion is normal. No trismus. No creptius on neck palpation and patient has good dentition. No gingival erythema or fluctuance noted. Mucus membranes moist.   Eyes: Pupils are equal, round, and reactive to light. Right eye exhibits no discharge. Left eye exhibits no discharge. No scleral icterus.  Neck: Trachea normal. Neck supple. No spinous process tenderness present. No neck rigidity. Normal range of motion present.  No nuchal rigidity or meningismus  Cardiovascular: Normal rate, regular rhythm and intact distal pulses.  No murmur heard. Pulses:      Radial pulses are 2+ on the right side, and 2+ on the left side.  Pulmonary/Chest: Effort normal and breath sounds normal. She exhibits no tenderness.  Abdominal: Soft. Bowel sounds are normal. There is no tenderness.  Musculoskeletal: She exhibits no edema.  Lymphadenopathy:    She has no cervical adenopathy.  Neurological: She is alert.  Skin: Skin is warm and dry. No rash noted. Rash is not vesicular. She is not diaphoretic.  No rash  Psychiatric: She has a normal mood and affect.  Nursing note and vitals reviewed.    ED Treatments / Results  Labs (all labs ordered are listed, but only abnormal results are displayed) Labs Reviewed - No data to  display  EKG None  Radiology No results found.  Procedures Procedures (including critical care time)  Medications Ordered in ED Medications  ibuprofen (ADVIL,MOTRIN) tablet 600 mg (has no administration in time range)     Initial Impression / Assessment and Plan / ED Course  I have reviewed the triage vital signs and the nursing notes.  Pertinent labs & imaging results that were available during my care of the patient were reviewed by me and considered in my medical decision making (see chart for details).     27 y.o. female with sinusitis/aom on exam. No concern for mastoiditis or meningitis.  Oropharyngeal exam unremarkable.  No evidence of PTA or RPA.  Will discharge home on Augmentin.  Return precautions were discussed.  Patient appears safe for discharge.  Final Clinical Impressions(s) / ED Diagnoses   Final diagnoses:  Otalgia of right ear  Acute non-recurrent maxillary sinusitis  ED Discharge Orders         Ordered    amoxicillin-clavulanate (AUGMENTIN) 875-125 MG tablet  Every 12 hours     04/12/18 0010           Princella Pellegrini 04/12/18 0012    Sabas Sous, MD 04/12/18 0025

## 2018-05-09 IMAGING — CR DG CHEST 2V
2 series · 2 of 2 positions shown · non-contrast
Comparison: February 15, 2017

CLINICAL DATA: Cough.

EXAM:
CHEST  2 VIEW

[chest pa]
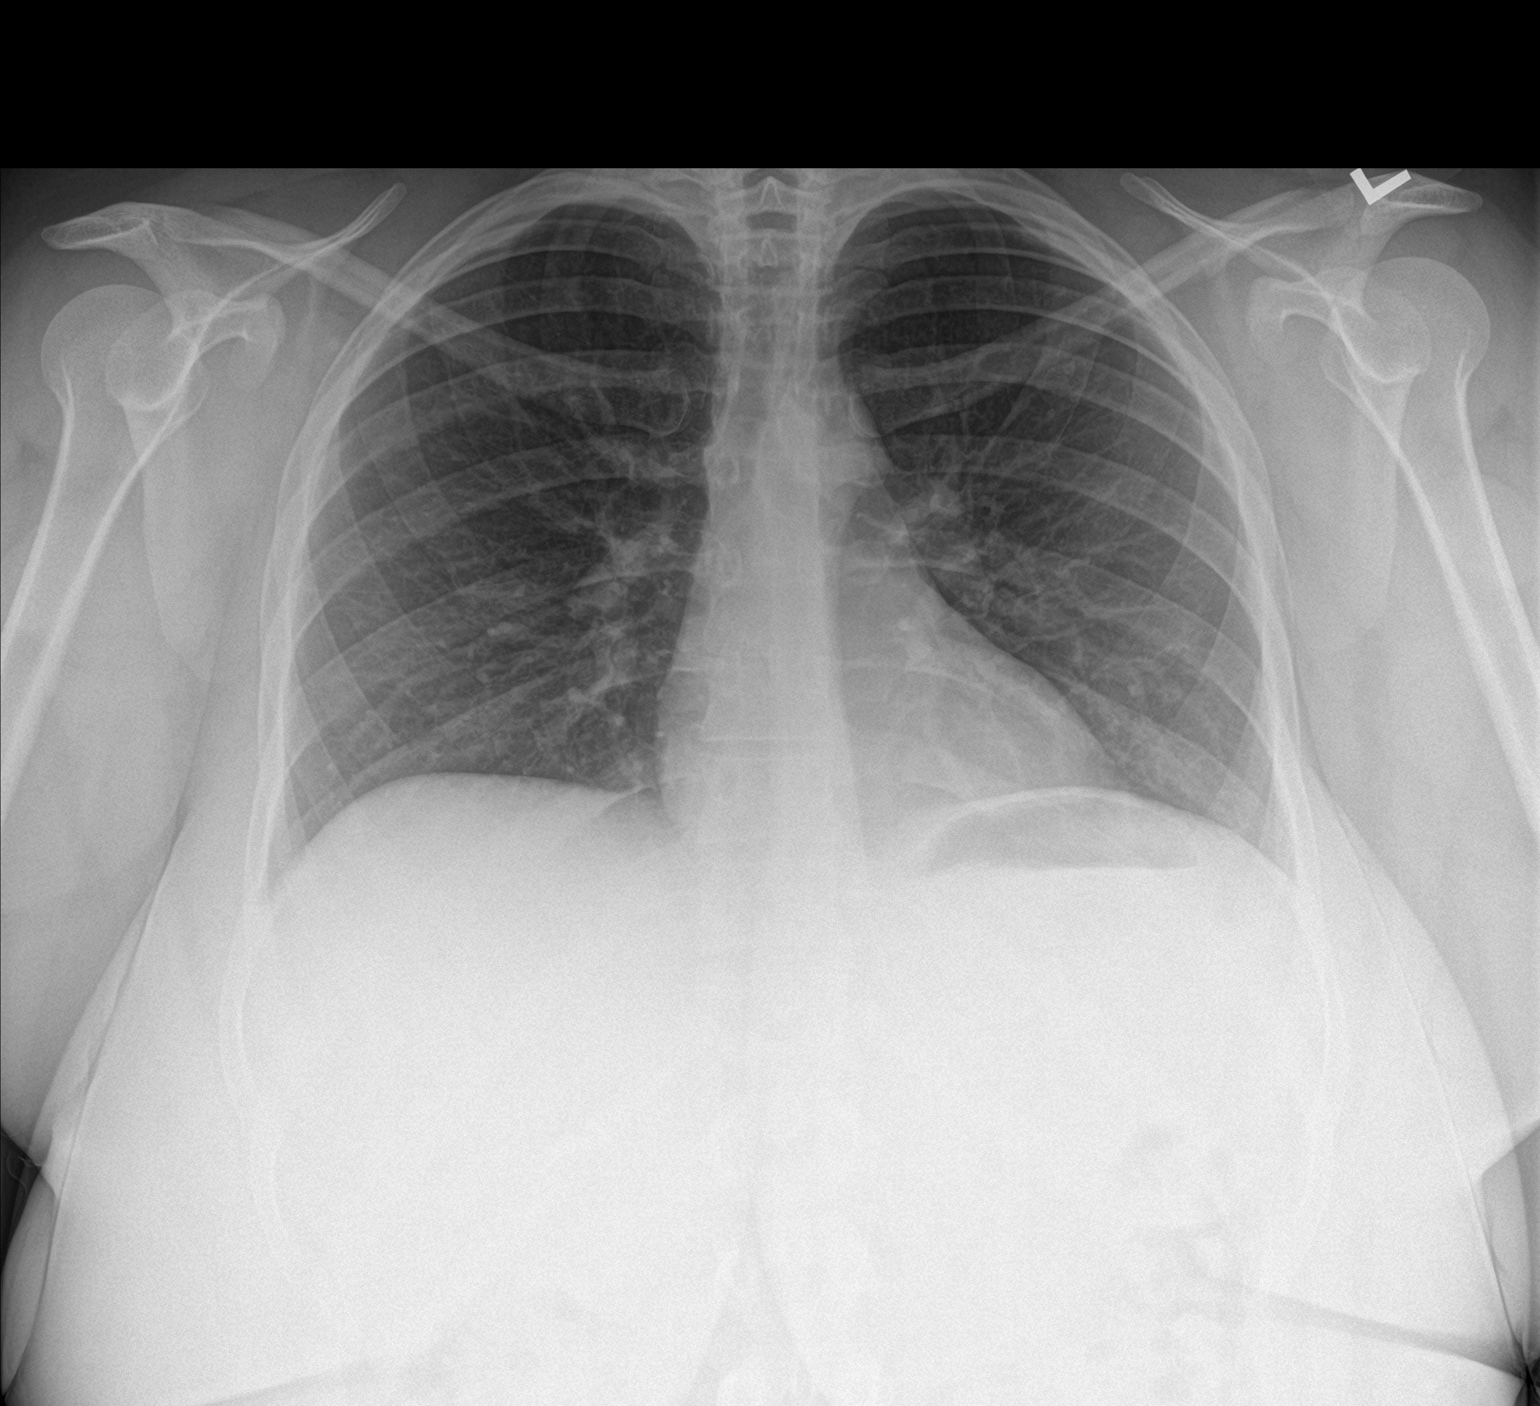

[chest lat]
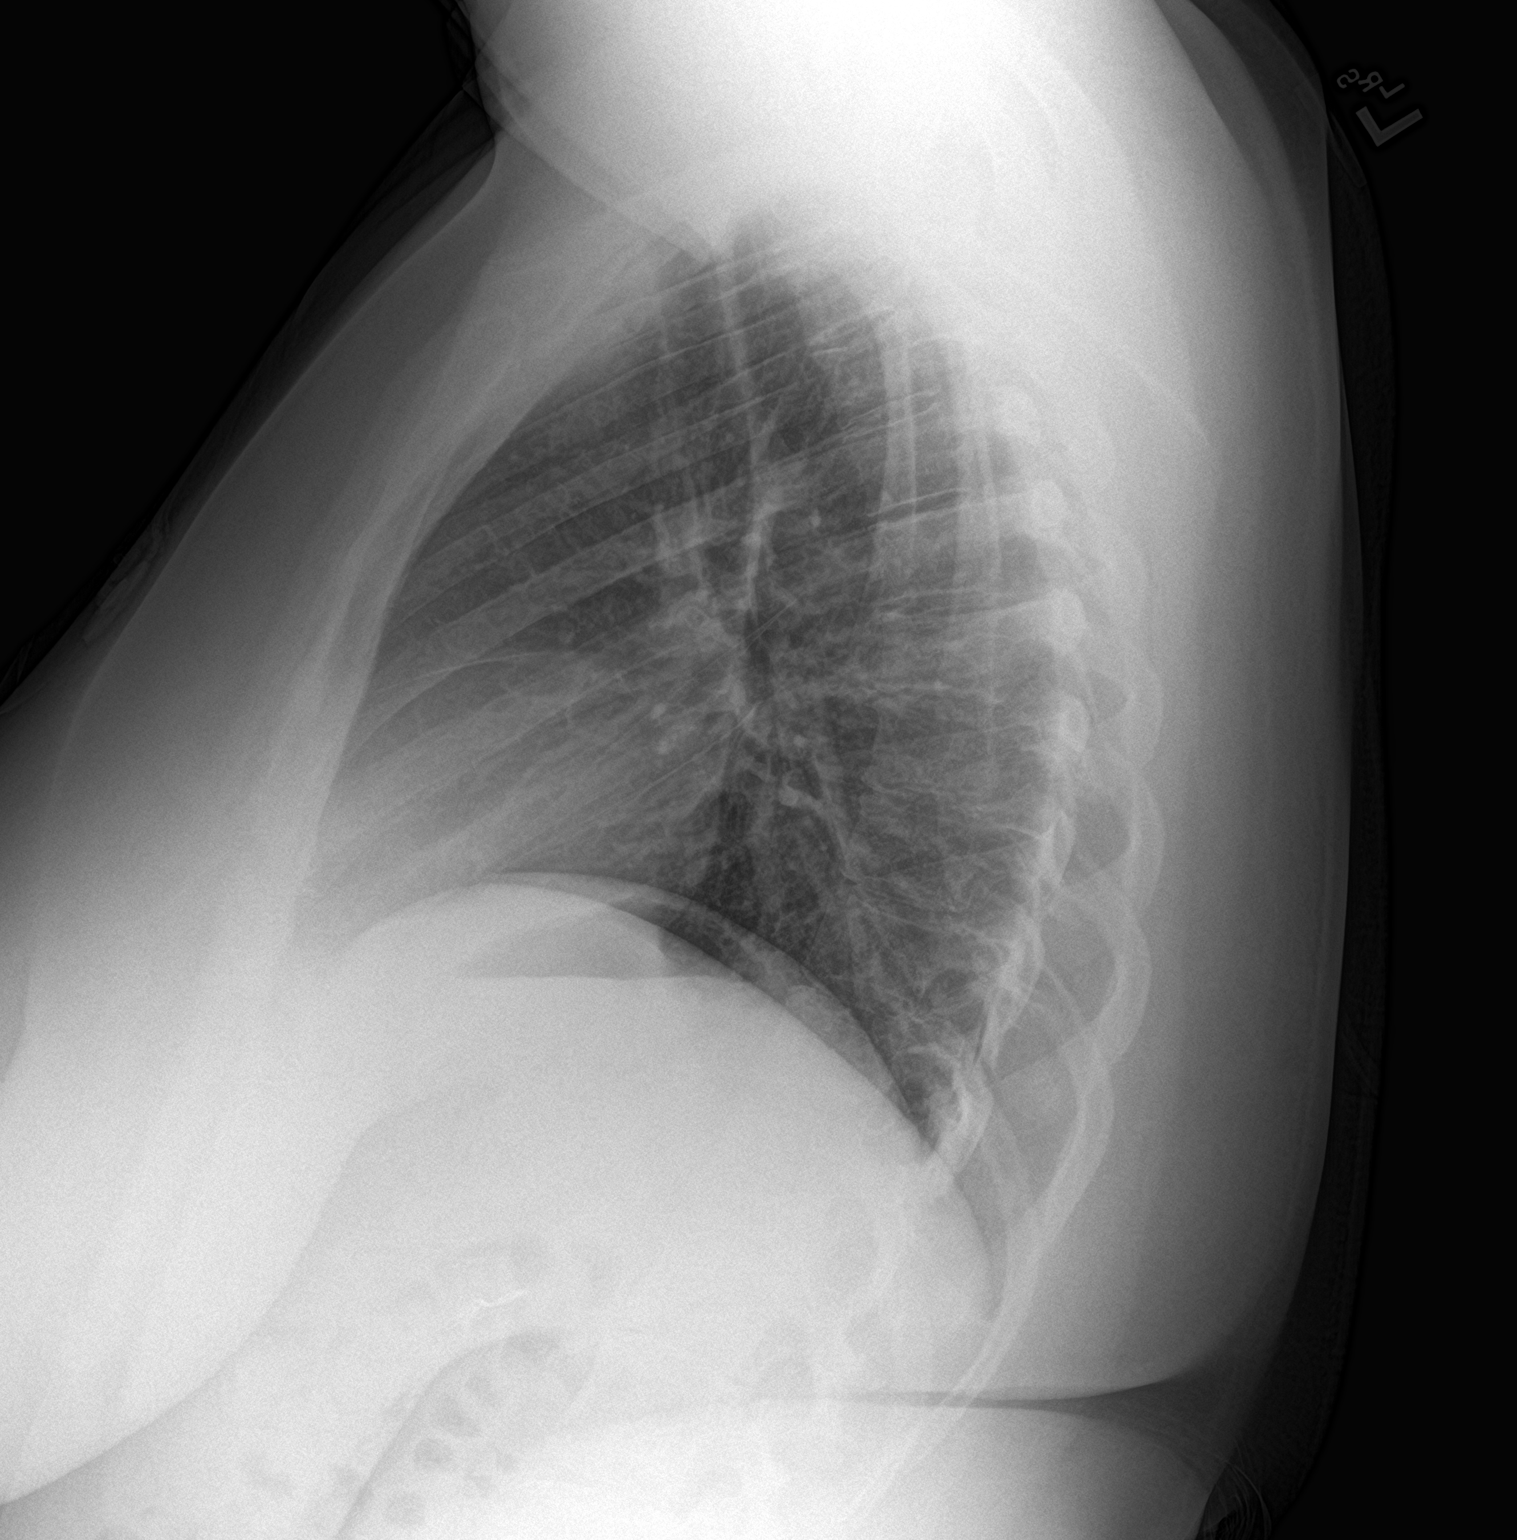

[2 of 2 positions shown; findings below may reference images not displayed]

FINDINGS: The heart size and mediastinal contours are within normal limits.
Both lungs are clear. The visualized skeletal structures are
unremarkable.
IMPRESSION: No active cardiopulmonary disease.

## 2018-06-23 ENCOUNTER — Encounter (HOSPITAL_COMMUNITY): Payer: Self-pay

## 2018-06-23 ENCOUNTER — Ambulatory Visit (HOSPITAL_COMMUNITY)
Admission: EM | Admit: 2018-06-23 | Discharge: 2018-06-23 | Disposition: A | Discharge status: Home / Self Care | Payer: Self-pay | Attending: Family Medicine | Admitting: Family Medicine

## 2018-06-23 DIAGNOSIS — J069 Acute upper respiratory infection, unspecified: Secondary | ICD-10-CM

## 2018-06-23 DIAGNOSIS — R05 Cough: Secondary | ICD-10-CM

## 2018-06-23 DIAGNOSIS — B9789 Other viral agents as the cause of diseases classified elsewhere: Secondary | ICD-10-CM

## 2018-06-23 MED ORDER — GUAIFENESIN ER 600 MG PO TB12: 600.0000 mg | ORAL_TABLET | Freq: Two times a day (BID) | ORAL | 0 refills | Status: DC

## 2018-06-23 MED ORDER — NAPROXEN 500 MG PO TABS: 500.0000 mg | ORAL_TABLET | Freq: Two times a day (BID) | ORAL | 0 refills | Status: DC

## 2018-06-23 NOTE — Discharge Instructions (Addendum)
I believe this is a viral upper respiratory infection You can take Mucinex for cough, congestion Naproxen for pain Inhaler as needed for cough, wheezing, shortness of breath Follow up as needed for continued or worsening symptoms

## 2018-06-23 NOTE — ED Triage Notes (Signed)
Pt presents with complaints of cough, fever, chills and body aches x 2 days.

## 2018-06-25 NOTE — ED Provider Notes (Signed)
[facility]    CSN: [id number] Arrival date & time: 06/23/18  [id number]     History   Chief Complaint Chief Complaint  Patient presents with  . Cough    HPI Deanna Castaneda is a 27 y.o. female.    URI  Presenting symptoms: congestion, cough, ear pain, fever, rhinorrhea and sore throat   Severity:  Moderate Duration:  2 days Timing:  Constant Progression:  Unchanged Chronicity:  New Relieved by:  Nothing Worsened by:  Nothing Ineffective treatments:  None tried Associated symptoms: myalgias   Risk factors: sick contacts   Risk factors: no recent illness and no recent travel     Past Medical History:  Diagnosis Date  . Asthma   . Sickle cell trait (HCC)     There are no active problems to display for this patient.   Past Surgical History:  Procedure Laterality Date  . CHOLECYSTECTOMY      OB History   None      Home Medications    Prior to Admission medications   Medication Sig Start Date End Date Taking? Authorizing Provider  albuterol (PROVENTIL HFA;VENTOLIN HFA) 108 (90 Base) MCG/ACT inhaler Inhale 1-2 puffs into the lungs every 6 (six) hours as needed for wheezing or shortness of breath.    [provider]  amoxicillin-clavulanate (AUGMENTIN) 875-125 MG tablet Take 1 tablet by mouth every 12 (twelve) hours. 04/12/18   Maczis, Elmer SowMichael M, PA-C  diclofenac sodium (VOLTAREN) 1 % GEL Apply 4 g topically 4 (four) times daily. Patient not taking: Reported on 08/03/2017 11/25/16   Harolyn RutherfordJoy, Shawn C, PA-C  famotidine (PEPCID) 20 MG tablet Take 1 tablet (20 mg total) by mouth 2 (two) times daily. 01/23/18   Kirichenko, Tatyana, PA-C  guaiFENesin (MUCINEX) 600 MG 12 hr tablet Take 1 tablet (600 mg total) by mouth 2 (two) times daily. 06/23/18   Dahlia ByesBast, Griffyn Kucinski A, NP  HYDROcodone-acetaminophen (NORCO/VICODIN) 5-325 MG tablet Take 1-2 tablets by mouth every 6 (six) hours as needed for moderate pain or severe pain. 01/18/18   Shaune PollackIsaacs, Cameron, MD  ibuprofen  (ADVIL,MOTRIN) 800 MG tablet Take 1 tablet (800 mg total) by mouth every 8 (eight) hours as needed. Patient not taking: Reported on 08/03/2017 02/15/17   Ward, Chase PicketJaime Pilcher, PA-C  lidocaine (LIDODERM) 5 % Place 1 patch onto the skin daily. Remove & Discard patch within 12 hours or as directed by MD Patient not taking: Reported on 08/03/2017 11/25/16   Joy, Hillard DankerShawn C, PA-C  methocarbamol (ROBAXIN) 500 MG tablet Take 1 tablet (500 mg total) by mouth 2 (two) times daily as needed for muscle spasms. Patient not taking: Reported on 08/03/2017 02/15/17   Ward, Chase PicketJaime Pilcher, PA-C  naproxen (NAPROSYN) 500 MG tablet Take 1 tablet (500 mg total) by mouth 2 (two) times daily. 06/23/18   Dahlia ByesBast, Adaleigh Warf A, NP  ondansetron (ZOFRAN ODT) 4 MG disintegrating tablet Take 1 tablet (4 mg total) by mouth every 8 (eight) hours as needed for nausea or vomiting. 01/18/18   Shaune PollackIsaacs, Cameron, MD  ondansetron (ZOFRAN) 4 MG tablet Take 1 tablet (4 mg total) by mouth every 6 (six) hours. Patient not taking: Reported on 08/03/2017 08/20/16   Janne NapoleonNeese, Hope M, NP  sucralfate (CARAFATE) 1 g tablet Take 1 tablet (1 g total) by mouth 4 (four) times daily -  with meals and at bedtime. 01/23/18   Jaynie CrumbleKirichenko, Tatyana, PA-C    Family History Family History  Problem Relation Age of Onset  . Healthy Mother   .  Sleep apnea Father     Social History Social History   Tobacco Use  . Smoking status: Never Smoker  . Smokeless tobacco: Never Used  Substance Use Topics  . Alcohol use: No  . Drug use: No     Allergies   Patient has no known allergies.   Review of Systems Review of Systems  Constitutional: Positive for fever.  HENT: Positive for congestion, ear pain, rhinorrhea and sore throat.   Respiratory: Positive for cough.   Musculoskeletal: Positive for myalgias.     Physical Exam Triage Vital Signs ED Triage Vitals  Enc Vitals Group     BP 06/23/18 0902 106/66     Pulse Rate 06/23/18 0902 88     Resp 06/23/18 0902 16     Temp  06/23/18 0902 99.1 F (37.3 C)     Temp Source 06/23/18 0902 Oral     SpO2 06/23/18 0902 100 %     Weight --      Height --      Head Circumference --      Peak Flow --      Pain Score 06/23/18 0906 9     Pain Loc --      Pain Edu? --      Excl. in GC? --    No data found.  Updated Vital Signs BP 106/66 (BP Location: Left Arm)   Pulse 88   Temp 99.1 F (37.3 C) (Oral)   Resp 16   LMP 06/23/2018   SpO2 100%   Visual Acuity Right Eye Distance:   Left Eye Distance:   Bilateral Distance:    Right Eye Near:   Left Eye Near:    Bilateral Near:     Physical Exam  Constitutional: She appears well-developed and well-nourished.  HENT:  Head: Normocephalic and atraumatic.  Right Ear: Hearing, tympanic membrane and external ear normal.  Left Ear: Hearing, tympanic membrane and external ear normal.  Nose: Nose normal.  Mouth/Throat: Oropharynx is clear and moist.  Eyes: Conjunctivae are normal.  Neck: Normal range of motion.  Cardiovascular: Normal rate, regular rhythm and normal heart sounds.  Pulmonary/Chest: Effort normal and breath sounds normal.  Musculoskeletal: Normal range of motion.  Lymphadenopathy:    She has no cervical adenopathy.  Neurological: She is alert.  Skin: Skin is warm and dry.  Psychiatric: She has a normal mood and affect.  Nursing note and vitals reviewed.    UC Treatments / Results  Labs (all labs ordered are listed, but only abnormal results are displayed) Labs Reviewed - No data to display  EKG None  Radiology No results found.  Procedures Procedures (including critical care time)  Medications Ordered in UC Medications - No data to display  Initial Impression / Assessment and Plan / UC Course  I have reviewed the triage vital signs and the nursing notes.  Pertinent labs & imaging results that were available during my care of the patient were reviewed by me and considered in my medical decision making (see chart for  details).     Viral URI- symptomatic treatment Final Clinical Impressions(s) / UC Diagnoses   Final diagnoses:  Viral URI with cough     Discharge Instructions     I believe this is a viral upper respiratory infection You can take Mucinex for cough, congestion Naproxen for pain Inhaler as needed for cough, wheezing, shortness of breath Follow up as needed for continued or worsening symptoms     ED Prescriptions  Medication Sig Dispense Auth. Provider   guaiFENesin (MUCINEX) 600 MG 12 hr tablet Take 1 tablet (600 mg total) by mouth 2 (two) times daily. 15 tablet Delonda Coley A, NP   naproxen (NAPROSYN) 500 MG tablet Take 1 tablet (500 mg total) by mouth 2 (two) times daily. 30 tablet Janace Aris, NP     Controlled Substance Prescriptions [place] Controlled Substance Registry consulted? no   Janace Aris, NP 06/25/18 1147

## 2018-06-26 ENCOUNTER — Ambulatory Visit (INDEPENDENT_AMBULATORY_CARE_PROVIDER_SITE_OTHER): Payer: Self-pay

## 2018-06-26 ENCOUNTER — Encounter (HOSPITAL_COMMUNITY): Payer: Self-pay | Admitting: Emergency Medicine

## 2018-06-26 ENCOUNTER — Ambulatory Visit (HOSPITAL_COMMUNITY)
Admission: EM | Admit: 2018-06-26 | Discharge: 2018-06-26 | Disposition: A | Discharge status: Home / Self Care | Payer: Medicaid Other | Attending: Family Medicine | Admitting: Family Medicine

## 2018-06-26 DIAGNOSIS — R062 Wheezing: Secondary | ICD-10-CM

## 2018-06-26 DIAGNOSIS — J069 Acute upper respiratory infection, unspecified: Secondary | ICD-10-CM

## 2018-06-26 DIAGNOSIS — B9789 Other viral agents as the cause of diseases classified elsewhere: Secondary | ICD-10-CM

## 2018-06-26 MED ORDER — PREDNISONE 10 MG (21) PO TBPK: ORAL_TABLET | Freq: Every day | ORAL | 0 refills | Status: DC

## 2018-06-26 NOTE — ED Provider Notes (Addendum)
[facility]   [id number] 06/26/18 Arrival Time: 5784  ASSESSMENT & PLAN:  1. Viral URI with cough   2. Wheezing    I have personally viewed the imaging studies ordered this visit. No sign of pneumonia.  Meds ordered this encounter  Medications  . predniSONE (STERAPRED UNI-PAK 21 TAB) 10 MG (21) TBPK tablet    Sig: Take by mouth daily. Take as directed.    Dispense:  21 tablet    Refill:  0   Continue albuterol inhaler/neb at home as needed.  Will f/u here if not seeing improvement over the next few days.  Discussed typical duration of symptoms. OTC symptom care as needed. Ensure adequate fluid intake and rest. May f/u with PCP or here as needed.  Reviewed expectations re: course of current medical issues. Questions answered. Outlined signs and symptoms indicating need for more acute intervention. Patient verbalized understanding. After Visit Summary given.   SUBJECTIVE: History from: patient.  Deanna Castaneda is a 27 y.o. female who presents with complaint of nasal congestion, post-nasal drainage, and a persistent dry cough. Seen here on 06/23/2018; note reviewed. Onset of symptoms about 5 days ago. Overall with fatigue and with body aches. SOB: occasional, esp when she feels that she is wheezing. Albuterol neb with only very mild help; temporary. Wheezing: moderate when present, esp when coughing. Fever: unsure; reports chills. Overall normal PO intake without n/v. Sick contacts: no. No specific or significant aggravating or alleviating factors reported. OTC treatment: none reported.  Social History   Tobacco Use  Smoking Status Never Smoker  Smokeless Tobacco Never Used   ROS: As per HPI. All other systems negative.    OBJECTIVE:  Vitals:   06/26/18 1123  BP: 118/73  Pulse: 79  Resp: 16  Temp: 98.4 F (36.9 C)  SpO2: 100%    General appearance: alert; appears fatigued HEENT: nasal congestion; clear runny nose; throat irritation secondary to  post-nasal drainage Neck: supple without LAD CV: RRR without murmer Lungs: unlabored respirations, with bilateral wheezing; slightly coarse breath sounds at R base; cough: moderate Abd: soft; non-tender Ext: no LE edema Skin: warm and dry Psychological: alert and cooperative; normal mood and affect  Imaging: Dg Chest 2 View  Result Date: 06/26/2018 CLINICAL DATA:  Nonproductive cough, shortness of breath, and mid chest pain for the past week. History of asthma. Nonsmoker. EXAM: CHEST - 2 VIEW COMPARISON:  PA and lateral chest x-ray of January 23, 2018 FINDINGS: The lungs are borderline hypo bow inflated. There is no focal infiltrate. There is no pleural effusion. The heart and pulmonary vascularity are normal. The mediastinum is normal in width. The trachea is midline. The bony thorax exhibits no acute abnormality. IMPRESSION: There is no pneumonia nor other active cardiopulmonary disease. Electronically Signed   By: David  [place] M.D.   On: 06/26/2018 12:22    No Known Allergies  Past Medical History:  Diagnosis Date  . Asthma   . Sickle cell trait (HCC)    Family History  Problem Relation Age of Onset  . Healthy Mother   . Sleep apnea Father    Social History   Socioeconomic History  . Marital status: Single    Spouse name: Not on file  . Number of children: Not on file  . Years of education: Not on file  . Highest education level: Not on file  Occupational History  . Not on file  Social Needs  . Financial resource strain: Not on file  . Food  insecurity:    Worry: Not on file    Inability: Not on file  . Transportation needs:    Medical: Not on file    Non-medical: Not on file  Tobacco Use  . Smoking status: Never Smoker  . Smokeless tobacco: Never Used  Substance and Sexual Activity  . Alcohol use: No  . Drug use: No  . Sexual activity: Yes    Birth control/protection: None  Lifestyle  . Physical activity:    Days per week: Not on file    Minutes per session:  Not on file  . Stress: Not on file  Relationships  . Social connections:    Talks on phone: Not on file    Gets together: Not on file    Attends religious service: Not on file    Active member of club or organization: Not on file    Attends meetings of clubs or organizations: Not on file    Relationship status: Not on file  . Intimate partner violence:    Fear of current or ex partner: Not on file    Emotionally abused: Not on file    Physically abused: Not on file    Forced sexual activity: Not on file  Other Topics Concern  . Not on file  Social History Narrative  . Not on file           Mardella LaymanHagler, Simrin Vegh, MD 06/26/18 1241    Mardella LaymanHagler, Cloteal Isaacson, MD 06/26/18 [phone]

## 2018-06-26 NOTE — ED Triage Notes (Signed)
Pt c/o cough, asthma flair up, states she has inhaler at home and nebulizer without relief. Pt here for same 11/22

## 2018-08-22 ENCOUNTER — Ambulatory Visit (HOSPITAL_COMMUNITY)
Admission: EM | Admit: 2018-08-22 | Discharge: 2018-08-22 | Disposition: A | Discharge status: Home / Self Care | Payer: BLUE CROSS/BLUE SHIELD | Attending: Family Medicine | Admitting: Family Medicine

## 2018-08-22 ENCOUNTER — Encounter (HOSPITAL_COMMUNITY): Payer: Self-pay | Admitting: Family Medicine

## 2018-08-22 DIAGNOSIS — J320 Chronic maxillary sinusitis: Secondary | ICD-10-CM

## 2018-08-22 DIAGNOSIS — J40 Bronchitis, not specified as acute or chronic: Secondary | ICD-10-CM | POA: Diagnosis not present

## 2018-08-22 MED ORDER — AMOXICILLIN 875 MG PO TABS: 875.0000 mg | ORAL_TABLET | Freq: Two times a day (BID) | ORAL | 0 refills | Status: DC

## 2018-08-22 MED ORDER — PREDNISONE 50 MG PO TABS: ORAL_TABLET | ORAL | 1 refills | Status: DC

## 2018-08-22 MED ORDER — HYDROCODONE-HOMATROPINE 5-1.5 MG/5ML PO SYRP: 5.0000 mL | ORAL_SOLUTION | Freq: Four times a day (QID) | ORAL | 0 refills | Status: DC | PRN

## 2018-08-22 NOTE — Discharge Instructions (Addendum)
You should see improvement in next 48 hours.

## 2018-08-22 NOTE — ED Provider Notes (Signed)
[facility]    CSN: [id number] Arrival date & time: 08/22/18  1813     History   Chief Complaint Chief Complaint  Patient presents with  . Cough  . Chest Pain  . Shortness of Breath  . Fatigue    HPI Kashauna Bucciarelli is a 28 y.o. female.   28 year old established [facility] who presents with 2 days of chest pain and headache.  Patient's past medical history is significant for asthma.  She has undergone a cholecystectomy in the past.  Pt complains of productive cough with mucus X 2 days,headache, shortness of breath, weakness, and left chest pain. 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  She is using her inhaler, but the cough and wheezing persist.     Past Medical History:  Diagnosis Date  . Asthma   . Sickle cell trait (HCC)     There are no active problems to display for this patient.   Past Surgical History:  Procedure Laterality Date  . CHOLECYSTECTOMY      OB History   No obstetric history on file.      Home Medications    Prior to Admission medications   Medication Sig Start Date End Date Taking? Authorizing Provider  albuterol (PROVENTIL HFA;VENTOLIN HFA) 108 (90 Base) MCG/ACT inhaler Inhale 1-2 puffs into the lungs every 6 (six) hours as needed for wheezing or shortness of breath.    [provider]  amoxicillin (AMOXIL) 875 MG tablet Take 1 tablet (875 mg total) by mouth 2 (two) times daily. 08/22/18   Elvina Sidle, MD  HYDROcodone-homatropine (HYDROMET) 5-1.5 MG/5ML syrup Take 5 mLs by mouth every 6 (six) hours as needed for cough. 08/22/18   Elvina Sidle, MD  predniSONE (DELTASONE) 50 MG tablet One daily with food 08/22/18   Elvina Sidle, MD    Family History Family History  Problem Relation Age of Onset  . Healthy Mother   . Sleep apnea Father     Social History Social History   Tobacco Use  . Smoking status: Never Smoker  . Smokeless tobacco: Never Used  Substance  Use Topics  . Alcohol use: No  . Drug use: No     Allergies   Patient has no known allergies.   Review of Systems Review of Systems   Physical Exam Triage Vital Signs ED Triage Vitals  Enc Vitals Group     BP      Pulse      Resp      Temp      Temp src      SpO2      Weight      Height      Head Circumference      Peak Flow      Pain Score      Pain Loc      Pain Edu?      Excl. in GC?    No data found.  Updated Vital Signs BP 110/61 (BP Location: Right Arm)   Pulse 70   Temp 98.4 F (36.9 C) (Oral)   Resp 16   LMP 08/16/2018   SpO2 100%   Physical Exam Vitals signs and nursing note reviewed.  Constitutional:      Appearance: She is well-developed.  HENT:     Head: Normocephalic and atraumatic.  Eyes:     Extraocular Movements: Extraocular movements intact.     Pupils: Pupils are equal, round, and reactive  to light.  Neck:     Musculoskeletal: Normal range of motion and neck supple.  Cardiovascular:     Heart sounds: Normal heart sounds.  Pulmonary:     Effort: Pulmonary effort is normal. No respiratory distress.     Breath sounds: Examination of the right-middle field reveals wheezing. Examination of the left-middle field reveals wheezing. Wheezing present.  Musculoskeletal: Normal range of motion.  Skin:    General: Skin is warm and dry.  Neurological:     General: No focal deficit present.     Mental Status: She is alert.  Psychiatric:        Mood and Affect: Mood normal.        Behavior: Behavior normal.      UC Treatments / Results  Labs (all labs ordered are listed, but only abnormal results are displayed) Labs Reviewed - No data to display  EKG None  Radiology No results found.  Procedures Procedures (including critical care time)  Medications Ordered in UC Medications - No data to display  Initial Impression / Assessment and Plan / UC Course  I have reviewed the triage vital signs and the nursing notes.  Pertinent  labs & imaging results that were available during my care of the patient were reviewed by me and considered in my medical decision making (see chart for details).    Final Clinical Impressions(s) / UC Diagnoses   Final diagnoses:  Bronchitis  Chronic maxillary sinusitis     Discharge Instructions     You should see improvement in next 48 hours.    ED Prescriptions    Medication Sig Dispense Auth. Provider   predniSONE (DELTASONE) 50 MG tablet One daily with food 3 tablet Elvina Sidle, MD   HYDROcodone-homatropine (HYDROMET) 5-1.5 MG/5ML syrup Take 5 mLs by mouth every 6 (six) hours as needed for cough. 60 mL Elvina Sidle, MD   amoxicillin (AMOXIL) 875 MG tablet Take 1 tablet (875 mg total) by mouth 2 (two) times daily. 20 tablet Elvina Sidle, MD     Controlled Substance Prescriptions [place] Controlled Substance Registry consulted? Not Applicable   Elvina Sidle, MD 08/22/18 Jerene Bears

## 2018-08-22 NOTE — ED Triage Notes (Signed)
Pt presents with productive cough with mucus X 2 days,headache,  shortness of breath, weakness, and left chest pain.

## 2018-08-28 ENCOUNTER — Other Ambulatory Visit: Payer: Self-pay

## 2018-08-28 ENCOUNTER — Emergency Department (HOSPITAL_COMMUNITY)
Admission: EM | Admit: 2018-08-28 | Discharge: 2018-08-29 | Disposition: A | Discharge status: Home / Self Care | Payer: BLUE CROSS/BLUE SHIELD | Attending: Emergency Medicine | Admitting: Emergency Medicine

## 2018-08-28 ENCOUNTER — Emergency Department (HOSPITAL_COMMUNITY): Payer: BLUE CROSS/BLUE SHIELD

## 2018-08-28 ENCOUNTER — Encounter (HOSPITAL_COMMUNITY): Payer: Self-pay

## 2018-08-28 DIAGNOSIS — R079 Chest pain, unspecified: Secondary | ICD-10-CM | POA: Diagnosis present

## 2018-08-28 DIAGNOSIS — Z5321 Procedure and treatment not carried out due to patient leaving prior to being seen by health care provider: Secondary | ICD-10-CM | POA: Insufficient documentation

## 2018-08-28 LAB — I-STAT BETA HCG BLOOD, ED (MC, WL, AP ONLY): I-stat hCG, quantitative: <5 m[IU]/mL (ref <5)

## 2018-08-28 LAB — CBC
HEMATOCRIT: 36.1 % (ref 36.0–46.0)
HEMOGLOBIN: 12.5 g/dL (ref 12.0–15.0)
MCH: 27.8 pg (ref 26.0–34.0)
MCHC: 34.6 g/dL (ref 30.0–36.0)
MCV: 80.2 fL (ref 80.0–100.0)
Platelets: 333 10*3/uL (ref 150–400)
RBC: 4.5 MIL/uL (ref 3.87–5.11)
RDW: 14.3 % (ref 11.5–15.5)
WBC: 9.7 10*3/uL (ref 4.0–10.5)
nRBC: 0 % (ref 0.0–0.2)

## 2018-08-28 LAB — I-STAT TROPONIN, ED: Troponin i, poc: 0 ng/mL (ref 0.00–0.08)

## 2018-08-28 LAB — BASIC METABOLIC PANEL
Anion gap: 10 (ref 5–15)
BUN: 12 mg/dL (ref 6–20)
CHLORIDE: 104 mmol/L (ref 98–111)
CO2: 23 mmol/L (ref 22–32)
Calcium: 9.2 mg/dL (ref 8.9–10.3)
Creatinine, Ser: 0.81 mg/dL (ref 0.44–1.00)
GFR calc Af Amer: >60 mL/min (ref >60)
GFR calc non Af Amer: >60 mL/min (ref >60)
GLUCOSE: 125 mg/dL — AB (ref 70–99)
POTASSIUM: 3.6 mmol/L (ref 3.5–5.1)
Sodium: 137 mmol/L (ref 135–145)

## 2018-08-28 MED ORDER — SODIUM CHLORIDE 0.9% FLUSH: 3.0000 mL | Freq: Once | INTRAVENOUS | Status: DC

## 2018-08-28 NOTE — ED Triage Notes (Signed)
Pt her with central chest pain for the last week.  Seen at [facility] for same and diagnosed with bronchitis. Given hydrocodone and amoxicillin at [facility] for symptoms.  States symptoms still there and getting worse.  A&Ox4.

## 2018-08-29 ENCOUNTER — Encounter (HOSPITAL_COMMUNITY): Payer: Self-pay

## 2018-08-29 ENCOUNTER — Ambulatory Visit (HOSPITAL_COMMUNITY)
Admission: EM | Admit: 2018-08-29 | Discharge: 2018-08-29 | Disposition: A | Discharge status: Home / Self Care | Payer: BLUE CROSS/BLUE SHIELD | Source: Home / Self Care

## 2018-08-29 DIAGNOSIS — R0789 Other chest pain: Secondary | ICD-10-CM

## 2018-08-29 MED ORDER — NAPROXEN 500 MG PO TABS: 500.0000 mg | ORAL_TABLET | Freq: Two times a day (BID) | ORAL | 0 refills | Status: DC

## 2018-08-29 NOTE — ED Notes (Signed)
No answer again for vitals recheckx2

## 2018-08-29 NOTE — ED Triage Notes (Signed)
Pt present central chest pain, symptoms has started a week ago.  Pt states the chest pain is not getting any better.  Pt also present abdominal pain. Pt denies any diarrhea.

## 2018-08-29 NOTE — Discharge Instructions (Addendum)
Negative chest pain work-up in the ED last night.   EKG appears stable today Will treat for potential chest wall pain with naproxen.  Take medication as prescribed.  If symptoms persists despite treatment over the next 24 hours go to the ED for further evaluation  If you have any new or worsening symptoms like fever, chills, fatigue, lightheadedness, dizziness, palpitations, racing heart, nausea, vomiting, shortness of breath, abdominal pain, diarrhea, changes in urinary or bowel habits, etc..Marland Kitchen

## 2018-08-29 NOTE — ED Provider Notes (Signed)
[facility]   [id number] 08/29/18 Arrival Time: 0930   CC: CHEST PAIN  SUBJECTIVE:  Deanna Castaneda is a [age] y.o. female who presents with complaint of gradual chest pain that began 2 weeks ago.  Denies a precipitating event, trauma, recent lower respiratory tract, or strenuous upper body activities.  [facility] and treated with amoxicillin and prednisone without relief.  Was seen in the ED last night and had negative CXR, troponins, CBC, CMP, and serum hCG.  EKG showed t-wave abnormalities.  LWBS due to 8 hour wait.  Localizes chest pain to the sternum.  Describes as worsening, that is constant and sharp in character.  Rates pain as 10/10.   Denies worsening factors or association with cough or eating.  Denies radiates symptoms.  Denies previous symptoms in the past. Complains of fatigue, SOB, intermittent chills, mild HA, dizziness, nausea, and mild abdominal pain.   Denies fever, lightheadedness, palpitations, tachycardia, vomiting, changes in bowel or bladder habits, diaphoresis, numbness/tingling in extremities, peripheral edema, or anxiety.    Denies calf pain or swelling, recent long travel, recent surgery, pregnancy, malignancy, tobacco use, hormone use, or previous blood clot  Gallbladder removal 2008.    Denies close relatives with cardiac hx   ROS: As per HPI. Past Medical History:  Diagnosis Date  . Asthma   . Sickle cell trait [facility](HCC)    Past Surgical History:  Procedure Laterality Date  . CHOLECYSTECTOMY     No Known Allergies No current facility-administered medications on file prior to encounter.    No current outpatient medications on file prior to encounter.   Social History   Socioeconomic History  . Marital status: Single    Spouse name: Not on file  . Number of children: Not on file  . Years of education: Not on file  . Highest education level: Not on file  Occupational History  . Not on file  Social Needs  . Financial resource  strain: Not on file  . Food insecurity:    Worry: Not on file    Inability: Not on file  . Transportation needs:    Medical: Not on file    Non-medical: Not on file  Tobacco Use  . Smoking status: Never Smoker  . Smokeless tobacco: Never Used  Substance and Sexual Activity  . Alcohol use: No  . Drug use: No  . Sexual activity: Yes    Birth control/protection: None  Lifestyle  . Physical activity:    Days per week: Not on file    Minutes per session: Not on file  . Stress: Not on file  Relationships  . Social connections:    Talks on phone: Not on file    Gets together: Not on file    Attends religious service: Not on file    Active member of club or organization: Not on file    Attends meetings of clubs or organizations: Not on file    Relationship status: Not on file  . Intimate partner violence:    Fear of current or ex partner: Not on file    Emotionally abused: Not on file    Physically abused: Not on file    Forced sexual activity: Not on file  Other Topics Concern  . Not on file  Social History Narrative  . Not on file   Family History  Problem Relation Age of Onset  . Healthy Mother   . Sleep apnea Father  OBJECTIVE:  Vitals:   08/29/18 1057  BP: 110/61  Pulse: 86  Resp: 16  Temp: 99.2 F (37.3 C)  TempSrc: Oral  SpO2: 100%    General appearance: alert; no distress Eyes: PERRLA; EOMI; conjunctiva normal HENT: normocephalic; atraumatic Neck: supple Lungs: clear to auscultation bilaterally without adventitious breath sounds Heart: regular rate and rhythm.   Chest Wall: TTP over anterior sternum Abdomen: soft, non-tender; bowel sounds normal; no guarding Extremities: no cyanosis or edema; symmetrical with no gross deformities Skin: warm and dry Psychological: alert and cooperative; normal mood and affect  ECG: Orders placed or performed during the hospital encounter of 08/29/18  . ED EKG  . ED EKG  . EKG 12-Lead  . EKG 12-Lead     EKG normal sinus rhythm without ST elevations, depressions, or prolonged PR interval.  No narrowing or widening of the QRS complexes.  Reviewed EKG with Dr. Tracie Harrier  LABS:  Results for orders placed or performed during the hospital encounter of 08/28/18  Basic metabolic panel  Result Value Ref Range   Sodium 137 135 - 145 mmol/L   Potassium 3.6 3.5 - 5.1 mmol/L   Chloride 104 98 - 111 mmol/L   CO2 23 22 - 32 mmol/L   Glucose, Bld 125 (H) 70 - 99 mg/dL   BUN 12 6 - 20 mg/dL   Creatinine, Ser 1.61 0.44 - 1.00 mg/dL   Calcium 9.2 8.9 - 09.6 mg/dL   GFR calc non Af Amer >60 >60 mL/min   GFR calc Af Amer >60 >60 mL/min   Anion gap 10 5 - 15  CBC  Result Value Ref Range   WBC 9.7 4.0 - 10.5 K/uL   RBC 4.50 3.87 - 5.11 MIL/uL   Hemoglobin 12.5 12.0 - 15.0 g/dL   HCT 04.5 40.9 - 81.1 %   MCV 80.2 80.0 - 100.0 fL   MCH 27.8 26.0 - 34.0 pg   MCHC 34.6 30.0 - 36.0 g/dL   RDW 91.4 78.2 - 95.6 %   Platelets 333 150 - 400 K/uL   nRBC 0.0 0.0 - 0.2 %  I-stat troponin, ED  Result Value Ref Range   Troponin i, poc 0.00 0.00 - 0.08 ng/mL   Comment 3          I-Stat beta hCG blood, ED  Result Value Ref Range   I-stat hCG, quantitative <5.0 <5 mIU/mL   Comment 3           Labs Reviewed - No data to display  DIAGNOSTIC STUDIES:  Dg Chest 2 View  Result Date: 08/28/2018 CLINICAL DATA:  28 y/o F; mid chest pain for 1 week with shortness of breath. EXAM: CHEST - 2 VIEW COMPARISON:  06/26/2018 chest radiograph. FINDINGS: Stable heart size and mediastinal contours are within normal limits. Both lungs are clear. The visualized skeletal structures are unremarkable. IMPRESSION: No active cardiopulmonary disease. Electronically Signed   By: Mitzi Hansen M.D.   On: 08/28/2018 22:53     MDM: Pt presenting with chest pain x 2 weeks.  Seen at [facility] and failed treatment for bronchitis and sinusitis with amoxicillin and prednisone 1 week ago.  VS stable.  TTP over sternum.  Was evaluated in  the ED last night and had a negative chest pain work-up including: CXR, troponins, CBC, CMP, and serum hCG.  EKG did show t-wave abnormalities last night.  EKG reviewed with Dr. Tracie Harrier today without t-wave abnormalities and NSR.  Offered further evaluation in the ED,  patient would like to try outpatient therapy first.  Will treat with naproxen for chest wall pain.  Given strict ER precautions if symptoms do not improve over the next 24 hours, or if she experiences any new or worsening symptoms.  ASSESSMENT & PLAN:  1. Chest wall pain     Meds ordered this encounter  Medications  . naproxen (NAPROSYN) 500 MG tablet    Sig: Take 1 tablet (500 mg total) by mouth 2 (two) times daily.    Dispense:  30 tablet    Refill:  0    Order Specific Question:   Supervising Provider    Answer:   Eustace MooreELSON, YVONNE SUE [[id number]]   Negative chest pain work-up in the ED last night.   EKG appears stable today Will treat for potential chest wall pain with naproxen.  Take medication as prescribed.  If symptoms persists despite treatment over the next 24 hours go to the ED for further evaluation  If you have any new or worsening symptoms like fever, chills, fatigue, lightheadedness, dizziness, palpitations, racing heart, nausea, vomiting, shortness of breath, abdominal pain, diarrhea, changes in urinary or bowel habits, etc...  Chest pain precautions given. Reviewed expectations re: course of current medical issues. Questions answered. Outlined signs and symptoms indicating need for more acute intervention. Patient verbalized understanding. After Visit Summary given.   Rennis HardingWurst, Mindy Behnken, PA-C 08/29/18 1241

## 2018-08-29 NOTE — ED Notes (Signed)
Pt didn't answer for vitals recheck x1 

## 2018-08-30 ENCOUNTER — Telehealth (HOSPITAL_COMMUNITY): Payer: Self-pay | Admitting: Emergency Medicine

## 2018-08-30 ENCOUNTER — Encounter (HOSPITAL_COMMUNITY): Payer: Self-pay | Admitting: Emergency Medicine

## 2018-08-30 ENCOUNTER — Other Ambulatory Visit: Payer: Self-pay

## 2018-08-30 ENCOUNTER — Emergency Department (HOSPITAL_COMMUNITY)
Admission: EM | Admit: 2018-08-30 | Discharge: 2018-08-30 | Disposition: A | Discharge status: Home / Self Care | Payer: BLUE CROSS/BLUE SHIELD | Attending: Emergency Medicine | Admitting: Emergency Medicine

## 2018-08-30 DIAGNOSIS — J209 Acute bronchitis, unspecified: Secondary | ICD-10-CM | POA: Insufficient documentation

## 2018-08-30 DIAGNOSIS — J40 Bronchitis, not specified as acute or chronic: Secondary | ICD-10-CM

## 2018-08-30 DIAGNOSIS — R079 Chest pain, unspecified: Secondary | ICD-10-CM | POA: Diagnosis present

## 2018-08-30 DIAGNOSIS — J45909 Unspecified asthma, uncomplicated: Secondary | ICD-10-CM | POA: Insufficient documentation

## 2018-08-30 DIAGNOSIS — R0789 Other chest pain: Secondary | ICD-10-CM

## 2018-08-30 LAB — D-DIMER, QUANTITATIVE: D-Dimer, Quant: 0.48 ug/mL-FEU (ref 0.00–0.50)

## 2018-08-30 MED ORDER — IBUPROFEN 400 MG PO TABS
400.0000 mg | ORAL_TABLET | Freq: Once | ORAL | Status: AC
  Administered 2018-08-30: 400 mg via ORAL
  Filled 2018-08-30: qty 1

## 2018-08-30 NOTE — ED Notes (Signed)
ED Provider at bedside. 

## 2018-08-30 NOTE — ED Provider Notes (Addendum)
[facility]   CSN: [id number] Arrival date & time: 08/30/18  1423     History   Chief Complaint Chief Complaint  Patient presents with  . Chest Pain  . URI    HPI Deanna Castaneda is a 28 y.o. female.  Complains of right-sided parasternal chest pain onset 2 weeks ago.  Pain is constant.  Not made better or worse by anything.  Chest pain covers approximately 2 cm area at right parasternal area.  Nonradiating.  She developed a cough, nonproductive 3 days ago.  She denies any fever.  Accompanying symptoms include shortness of breath.  [facility]/2018, and again yesterday.  Diagnosed with bronchitis.  Treated with amoxicillin, prednisone, hydrocodone syrup without relief she is also used her albuterol inhaler without relief.  No other associated symptoms.  HPI  Past Medical History:  Diagnosis Date  . Asthma   . Sickle cell trait (HCC)     There are no active problems to display for this patient.   Past Surgical History:  Procedure Laterality Date  . CHOLECYSTECTOMY       OB History   No obstetric history on file.      Home Medications    Prior to Admission medications   Medication Sig Start Date End Date Taking? Authorizing Provider  naproxen (NAPROSYN) 500 MG tablet Take 1 tablet (500 mg total) by mouth 2 (two) times daily. 08/29/18   Rennis HardingWurst, Brittany, PA-C    Family History Family History  Problem Relation Age of Onset  . Healthy Mother   . Sleep apnea Father     Social History Social History   Tobacco Use  . Smoking status: Never Smoker  . Smokeless tobacco: Never Used  Substance Use Topics  . Alcohol use: No  . Drug use: No     Allergies   Patient has no known allergies.   Review of Systems Review of Systems  Constitutional: Negative.   HENT: Negative.   Respiratory: Positive for cough and shortness of breath.   Cardiovascular: Positive for chest  pain.  Gastrointestinal: Negative.   Musculoskeletal: Negative.   Skin: Negative.   Neurological: Negative.   Psychiatric/Behavioral: Negative.   All other systems reviewed and are negative.    Physical Exam Updated Vital Signs BP 109/64   Pulse 95   Temp (S) 98.7 F (37.1 C) (Oral)   Resp (!) 21   Ht 5\' 2"  (1.575 m)   Wt 95.3 kg   LMP 08/16/2018   SpO2 99%   BMI 38.41 kg/m   Physical Exam Vitals signs and nursing note reviewed.  Constitutional:      General: She is not in acute distress.    Appearance: Normal appearance. She is well-developed. She is obese. She is not ill-appearing.  HENT:     Head: Normocephalic and atraumatic.     Mouth/Throat:     Mouth: Mucous membranes are moist.  Eyes:     Conjunctiva/sclera: Conjunctivae normal.     Pupils: Pupils are equal, round, and reactive to light.  Neck:     Musculoskeletal: Neck supple.     Thyroid: No thyromegaly.     Vascular: No carotid bruit.     Trachea: No tracheal deviation.  Cardiovascular:     Rate and Rhythm: Normal rate and regular rhythm.     Heart sounds: No murmur.  Pulmonary:     Effort: Pulmonary effort is normal. No respiratory  distress.     Breath sounds: Normal breath sounds.     Comments: Occasional cough.  Tender right parasternal area reproducing pain exactly Chest:     Chest wall: Tenderness present.  Abdominal:     General: Bowel sounds are normal. There is no distension.     Palpations: Abdomen is soft.     Tenderness: There is no abdominal tenderness.     Comments: Obese  Musculoskeletal: Normal range of motion.        General: No tenderness.  Skin:    General: Skin is warm and dry.     Findings: No rash.  Neurological:     Mental Status: She is alert.     Coordination: Coordination normal.      ED Treatments / Results  Labs (all labs ordered are listed, but only abnormal results are displayed) Labs Reviewed  D-DIMER, QUANTITATIVE (NOT AT [facility])    EKG EKG  Interpretation  Date/Time:  Wednesday August 30 2018 14:37:32 EST Ventricular Rate:  100 PR Interval:    QRS Duration: 83 QT Interval:  338 QTC Calculation: 436 R Axis:   21 Text Interpretation:  Sinus tachycardia Consider right atrial enlargement Borderline T abnormalities, diffuse leads No significant change since last tracing Abnormal ekg Confirmed by Gerhard Munch [phone]) on 08/30/2018 2:42:56 PM   Radiology Dg Chest 2 View  Result Date: 08/28/2018 CLINICAL DATA:  28 y/o F; mid chest pain for 1 week with shortness of breath. EXAM: CHEST - 2 VIEW COMPARISON:  06/26/2018 chest radiograph. FINDINGS: Stable heart size and mediastinal contours are within normal limits. Both lungs are clear. The visualized skeletal structures are unremarkable. IMPRESSION: No active cardiopulmonary disease. Electronically Signed   By: Mitzi Hansen M.D.   On: 08/28/2018 22:53    Procedures Procedures (including critical care time)  Medications Ordered in ED Medications - No data to display  Results for orders placed or performed during the hospital encounter of 08/30/18  D-dimer, quantitative (not at [facility])  Result Value Ref Range   D-Dimer, Quant 0.48 0.00 - 0.50 ug/mL-FEU   Dg Chest 2 View  Result Date: 08/28/2018 CLINICAL DATA:  28 y/o F; mid chest pain for 1 week with shortness of breath. EXAM: CHEST - 2 VIEW COMPARISON:  06/26/2018 chest radiograph. FINDINGS: Stable heart size and mediastinal contours are within normal limits. Both lungs are clear. The visualized skeletal structures are unremarkable. IMPRESSION: No active cardiopulmonary disease. Electronically Signed   By: Mitzi Hansen M.D.   On: 08/28/2018 22:53   Initial Impression / Assessment and Plan / ED Course  I have reviewed the triage vital signs and the nursing notes.  Pertinent labs & imaging results that were available during my care of the patient were reviewed by me and considered in my medical decision  making (see chart for details).     Administered ibuprofen 400 mg x 1 dose at 4 PM.  4:05 PM resting comfortably.  Appears in no distress Low pretest clinical suspicion for pulmonary embolism.  Negative d-dimer.  Lab chest x-ray and EKG work from 08/28/18 20 reviewed chest x-ray reviewed by me.  Symptoms consistent with acute bronchitis.  Exam consistent with chest wall pain plan I told the patient she can discontinue amoxicillin.  Use her albuterol inhaler 2 puffs every 4 hours as needed for cough or shortness of breath.   Follow-up with PMD, health department if not better in 2 weeks  Final Clinical Impressions(s) / ED Diagnoses  Dx #1 acute  bronchitis #2 chest wall pain Final diagnoses:  None    ED Discharge Orders    None       Doug SouJacubowitz, Ivylynn Hoppes, MD 08/30/18 1609    Doug SouJacubowitz, Damonie Ellenwood, MD 08/30/18 [phone]

## 2018-08-30 NOTE — ED Notes (Signed)
Patient verbalizes understanding of discharge instructions. Opportunity for questioning and answers were provided. Armband removed by staff, pt discharged from ED ambulatory.   

## 2018-08-30 NOTE — ED Notes (Signed)
Patient ambulatory to bathroom with steady gait at this time 

## 2018-08-30 NOTE — ED Triage Notes (Signed)
Pt has been seen multiple times recently for same complaint- states "not getting any better" is taking amoxicillan, hydrocodone cough medicine, without relief.  Pt in no apparent distress.

## 2018-08-30 NOTE — Telephone Encounter (Signed)
Pt called stating she feels no better after naproxen, cough syrup with codeine and antibiotics, per brittanys note, pt should be seen in ER for further eval. Pt agreeable to plan, given shortest waits for ERs, will go now.

## 2018-08-30 NOTE — Discharge Instructions (Addendum)
It is okay to discontinue amoxicillin.  Use your albuterol inhaler 2 puffs every 4 hours as needed for cough or shortness of breath.  See your primary care provider at the health department if not better in 2 weeks.

## 2018-11-28 ENCOUNTER — Ambulatory Visit (HOSPITAL_COMMUNITY)
Admission: EM | Admit: 2018-11-28 | Discharge: 2018-11-28 | Disposition: A | Discharge status: Home / Self Care | Payer: BLUE CROSS/BLUE SHIELD | Attending: Family Medicine | Admitting: Family Medicine

## 2018-11-28 ENCOUNTER — Encounter (HOSPITAL_COMMUNITY): Payer: Self-pay

## 2018-11-28 ENCOUNTER — Other Ambulatory Visit: Payer: Self-pay

## 2018-11-28 DIAGNOSIS — R4689 Other symptoms and signs involving appearance and behavior: Secondary | ICD-10-CM

## 2018-11-28 DIAGNOSIS — Z20822 Contact with and (suspected) exposure to covid-19: Secondary | ICD-10-CM

## 2018-11-28 DIAGNOSIS — R6889 Other general symptoms and signs: Secondary | ICD-10-CM

## 2018-11-28 DIAGNOSIS — F99 Mental disorder, not otherwise specified: Secondary | ICD-10-CM

## 2018-11-28 NOTE — ED Triage Notes (Signed)
Pt began having chest tightness with coughing this morning, also reports chills and states sister was tested for covid yesterday and was positive, pt has had conatct with her

## 2018-11-28 NOTE — ED Provider Notes (Signed)
[facility]    CSN: [id number] Arrival date & time: 11/28/18  1610     History   Chief Complaint Chief Complaint  Patient presents with  . Cough    HPI Deanna Castaneda is a 28 y.o. female.   HPI   Patient presents to the urgent care center for chest tightness, coughing, and chills that started this morning.  She states that her sister tested positive for COVID-19 yesterday.  She states she has had contact with her sister.  She believes it is possible that she has COVID-19.  She presented to our doorway greeter who evaluates for coverage exposure, screens medical questions, and tries to limit COVID exposure with a complaint of chest pain.  She answered negatively to the COVID screening questions stating she did not have exposure to COVID.  When I asked her why she was dishonest with a greeter is because she wanted to be seen, and was afraid if she told them she had COVID she would be declined.  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  She is uncertain if they have had COVID testing  Patient does not have a primary care doctor.  She has had 10 visits to the emergency room and urgent care center over the last 12 months  Past Medical History:  Diagnosis Date  . Asthma   . Sickle cell trait (HCC)     There are no active problems to display for this patient.   Past Surgical History:  Procedure Laterality Date  . CHOLECYSTECTOMY      OB History   No obstetric history on file.      Home Medications    Prior to Admission medications   Medication Sig Start Date End Date Taking? Authorizing Provider  naproxen (NAPROSYN) 500 MG tablet Take 1 tablet (500 mg total) by mouth 2 (two) times daily. 08/29/18   Rennis HardingWurst, Brittany, PA-C    Family History Family History  Problem Relation Age of Onset  . Healthy Mother   . Sleep apnea  Father     Social History Social History   Tobacco Use  . Smoking status: Never Smoker  . Smokeless tobacco: Never Used  Substance Use Topics  . Alcohol use: No  . Drug use: No     Allergies   Patient has no known allergies.   Review of Systems Review of Systems  Constitutional: Positive for chills and fever.  HENT: Negative for ear pain and sore throat.   Eyes: Negative for pain and visual disturbance.  Respiratory: Positive for cough, chest tightness and shortness of breath.   Cardiovascular: Negative for chest pain and palpitations.  Gastrointestinal: Negative for abdominal pain and vomiting.  Genitourinary: Positive for menstrual problem. Negative for dysuria and hematuria.  Musculoskeletal: Negative for arthralgias and back pain.  Skin: Negative for color change and rash.  Neurological: Negative for seizures and syncope.  All other systems reviewed and are negative.    Physical Exam Triage Vital Signs ED Triage Vitals  Enc Vitals Group     BP 11/28/18 1635 110/73     Pulse Rate 11/28/18 1635 89     Resp 11/28/18 1635 20     Temp 11/28/18 1635 98.6 F (37 C)     Temp src --      SpO2 11/28/18 1635 100 %  Weight --      Height --      Head Circumference --      Peak Flow --      Pain Score 11/28/18 1637 9     Pain Loc --      Pain Edu? --      Excl. in GC? --    No data found.  Updated Vital Signs BP 110/73   Pulse 89   Temp 98.6 F (37 C)   Resp 20   LMP 10/16/2018   SpO2 100%       Physical Exam Constitutional:      General: She is not in acute distress.    Appearance: She is obese. She is not ill-appearing.  HENT:     Head: Normocephalic.  Cardiovascular:     Rate and Rhythm: Normal rate.  Neurological:     Mental Status: She is alert.  Psychiatric:        Mood and Affect: Affect is labile, angry and inappropriate.        Behavior: Behavior is uncooperative, agitated and aggressive.        Judgment: Judgment is impulsive and  inappropriate.    I reviewed the patient's triage note which indicated that she thought she had COVID-19 prior to entering the exam room.  I had appropriate COVID-19 protective care on, shield, and 95 mask, gloves, gown and [place].  From the door way I observe that she had her mask off of her face, was talking on the telephone, pausing to cough towards her elbow.  I immediately interrupted her phone conversation and told her to please put on her mask.  She held up her index finger for me to pause to finish her conversation and I repeated my instruction "put on the mask, now" I proceeded to inform her that she was not behaving in a way that demonstrated understanding of the seriousness of possible COVID infection.  I inquired why she had been untruthful with our first triage effort.  She did admit to me was because she wanted to be seen, wanted a COVID test, wanted a work note, and wanted a pregnancy test.  I explained to her that that was inappropriate and was unnecessarily exposing my staff and other patients in the lobby to possible COVID-19.  I further told her that if she had been truthful they would have informed her that we do not do COVID-19 testing at this facility.  I inquired whether she thought she might have COVID-19 and she stated yes.  I asked what instructions her sister had received regarding COVID-19 treatment.  She answered that the sister was told to stay at home and take Tylenol.  I informed her that that was correct and that patients with COVID-19 should stay home.  This included herself.  I told her there were numerous phone numbers available for people who thought they might have COVID to offer them appropriate and safe information regarding the illness, testing, and treatment without exposing others.   The patient became defensive and angry.  She told me she had not come to the office for a "F-ing lecture".  She informed me that if I was not going to test her for COVID she might as  well leave.  I agreed with her that going home is a good idea and offered to provide her with written information about COVID so she would better understand the infection, the treatment, and the quarantine.  She then told me she  did not want my"F-ing" papers.  I asked the patient to remain in the room and in her mask until I could discharge her.  In [place] of this she walked out of the room and I escorted her to her back door.  I implored her to please keep her mask on, to go home, and to try to limit her exposure to others.  She took the mask off her face and called me a bad name (M** F**) and me to shut my mouth where she was going to come back and hit me.  She then stormed off   UC Treatments / Results  Labs 2(all labs ordered are listed, but only abnormal results are displayed) Labs Reviewed - No data to display  EKG None  Radiology No results found.   Medications Ordered in UC Medications - No data to display  Initial Impression / Assessment and Plan / UC Course  I have reviewed the triage vital signs and the nursing notes.   2    Final Clinical Impressions(s) / UC Diagnoses   Final diagnoses:  Suspected Covid-19 Virus Infection   Discharge Instructions   None    ED Prescriptions    None     Controlled Substance Prescriptions [place] Controlled Substance Registry consulted? Not Applicable   Eustace Moore, MD 11/28/18 2136

## 2018-11-28 NOTE — ED Notes (Signed)
Pt verbally abusive to staff and ambulated out of department

## 2018-12-18 ENCOUNTER — Ambulatory Visit (HOSPITAL_COMMUNITY)
Admission: EM | Admit: 2018-12-18 | Discharge: 2018-12-18 | Disposition: A | Discharge status: Home / Self Care | Payer: BLUE CROSS/BLUE SHIELD | Attending: Family Medicine | Admitting: Family Medicine

## 2018-12-18 ENCOUNTER — Other Ambulatory Visit: Payer: Self-pay

## 2018-12-18 ENCOUNTER — Encounter (HOSPITAL_COMMUNITY): Payer: Self-pay

## 2018-12-18 DIAGNOSIS — S39012A Strain of muscle, fascia and tendon of lower back, initial encounter: Secondary | ICD-10-CM | POA: Diagnosis not present

## 2018-12-18 MED ORDER — CYCLOBENZAPRINE HCL 10 MG PO TABS: 10.0000 mg | ORAL_TABLET | Freq: Two times a day (BID) | ORAL | 0 refills | Status: DC | PRN

## 2018-12-18 MED ORDER — NAPROXEN 500 MG PO TABS: 500.0000 mg | ORAL_TABLET | Freq: Two times a day (BID) | ORAL | 0 refills | Status: DC
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
## 2018-12-18 NOTE — Discharge Instructions (Addendum)
We are treating you for musculoskeletal back pain Flexeril for muscle relaxant.  Be aware this will make you drowsy. Naproxen for pain and inflammation Gentle stretching, heat and massage can help Follow up as needed for continued or worsening symptoms

## 2019-02-01 ENCOUNTER — Ambulatory Visit (HOSPITAL_COMMUNITY)
Admission: EM | Admit: 2019-02-01 | Discharge: 2019-02-01 | Disposition: A | Discharge status: Home / Self Care | Payer: BLUE CROSS/BLUE SHIELD | Attending: Family Medicine | Admitting: Family Medicine

## 2019-02-01 ENCOUNTER — Encounter (HOSPITAL_COMMUNITY): Payer: Self-pay | Admitting: Emergency Medicine

## 2019-02-01 ENCOUNTER — Other Ambulatory Visit: Payer: Self-pay

## 2019-02-01 DIAGNOSIS — R6883 Chills (without fever): Secondary | ICD-10-CM | POA: Diagnosis not present

## 2019-02-01 DIAGNOSIS — R51 Headache: Secondary | ICD-10-CM

## 2019-02-01 DIAGNOSIS — R509 Fever, unspecified: Secondary | ICD-10-CM | POA: Diagnosis not present

## 2019-02-01 DIAGNOSIS — Z20822 Contact with and (suspected) exposure to covid-19: Secondary | ICD-10-CM

## 2019-02-01 DIAGNOSIS — R000 Tachycardia, unspecified: Secondary | ICD-10-CM | POA: Diagnosis not present

## 2019-02-01 DIAGNOSIS — R6889 Other general symptoms and signs: Secondary | ICD-10-CM

## 2019-02-01 MED ORDER — IBUPROFEN 600 MG PO TABS: 600.0000 mg | ORAL_TABLET | Freq: Four times a day (QID) | ORAL | 0 refills | Status: DC | PRN

## 2019-02-01 MED ORDER — ACETAMINOPHEN 325 MG PO TABS
ORAL_TABLET | ORAL | Status: AC
  Filled 2019-02-01: qty 2

## 2019-02-01 MED ORDER — ACETAMINOPHEN 325 MG PO TABS
650.0000 mg | ORAL_TABLET | Freq: Once | ORAL | Status: AC
  Administered 2019-02-01: 650 mg via ORAL

## 2019-02-01 NOTE — Discharge Instructions (Addendum)
Someone will be in contact with you for COVID testing [address]  Use anti-inflammatories for fever/headache/pain/swelling. You may take up to 800 mg Ibuprofen every 8 hours with food. You may supplement Ibuprofen with Tylenol [phone] mg every 8 hours.   Rest and drink plenty of fluids  Follow up if symptoms not resolving, worsening, changing, developing difficulty breathing, shortness of breath     Person Under Monitoring Name: Deanna Castaneda  Location: [address] [place] [id number]   Infection Prevention Recommendations for Individuals Confirmed to have, or Being Evaluated for, 2019 Novel Coronavirus (COVID-19) Infection Who Receive Care at Home  Individuals who are confirmed to have, or are being evaluated for, COVID-19 should follow the prevention steps below until a healthcare provider or local or state health department says they can return to normal activities.  Stay home except to get medical care You should restrict activities outside your home, except for getting medical care. Do not go to work, school, or public areas, and do not use public transportation or taxis.  Call ahead before visiting your doctor Before your medical appointment, call the healthcare provider and tell them that you have, or are being evaluated for, COVID-19 infection. This will help the healthcare providers office take steps to keep other people from getting infected. Ask your healthcare provider to call the local or state health department.  Monitor your symptoms Seek prompt medical attention if your illness is worsening (e.g., difficulty breathing). Before going to your medical appointment, call the healthcare provider and tell them that you have, or are being evaluated for, COVID-19 infection. Ask your healthcare provider to call the local or state health department.  Wear a facemask You should wear a facemask that covers your nose and mouth when you are in the same  room with other people and when you visit a healthcare provider. People who live with or visit you should also wear a facemask while they are in the same room with you.  Separate yourself from other people in your home As much as possible, you should stay in a different room from other people in your home. Also, you should use a separate bathroom, if available.  Avoid sharing household items You should not share dishes, drinking glasses, cups, eating utensils, towels, bedding, or other items with other people in your home. After using these items, you should wash them thoroughly with soap and water.  Cover your coughs and sneezes Cover your mouth and nose with a tissue when you cough or sneeze, or you can cough or sneeze into your sleeve. Throw used tissues in a lined trash can, and immediately wash your hands with soap and water for at least 20 seconds or use an alcohol-based hand rub.  Wash your [employer] Pacific Corporationhands Wash your hands often and thoroughly with soap and water for at least 20 seconds. You can use an alcohol-based hand sanitizer if soap and water are not available and if your hands are not visibly dirty. Avoid touching your eyes, nose, and mouth with unwashed hands.   Prevention Steps for Caregivers and Household Members of Individuals Confirmed to have, or Being Evaluated for, COVID-19 Infection Being Cared for in the Home  If you live with, or provide care at home for, a person confirmed to have, or being evaluated for, COVID-19 infection please follow these guidelines to prevent infection:  Follow healthcare providers instructions Make sure that you understand and can help the patient follow any healthcare provider instructions for all  care.  Provide for the patients basic needs You should help the patient with basic needs in the home and provide support for getting groceries, prescriptions, and other personal needs.  Monitor the patients symptoms If they are getting sicker,  call his or her medical provider and tell them that the patient has, or is being evaluated for, COVID-19 infection. This will help the healthcare providers office take steps to keep other people from getting infected. Ask the healthcare provider to call the local or state health department.  Limit the number of people who have contact with the patient If possible, have only one caregiver for the patient. Other household members should stay in another home or place of residence. If this is not possible, they should stay in another room, or be separated from the patient as much as possible. Use a separate bathroom, if available. Restrict visitors who do not have an essential need to be in the home.  Keep older adults, very young children, and other sick people away from the patient Keep older adults, very young children, and those who have compromised immune systems or chronic health conditions away from the patient. This includes people with chronic heart, lung, or kidney conditions, diabetes, and cancer.  Ensure good ventilation Make sure that shared spaces in the home have good air flow, such as from an air conditioner or an opened window, weather permitting.  Wash your hands often Wash your hands often and thoroughly with soap and water for at least 20 seconds. You can use an alcohol based hand sanitizer if soap and water are not available and if your hands are not visibly dirty. Avoid touching your eyes, nose, and mouth with unwashed hands. Use disposable paper towels to dry your hands. If not available, use dedicated cloth towels and replace them when they become wet.  Wear a facemask and gloves Wear a disposable facemask at all times in the room and gloves when you touch or have contact with the patients blood, body fluids, and/or secretions or excretions, such as sweat, saliva, sputum, nasal mucus, vomit, urine, or feces.  Ensure the mask fits over your nose and mouth tightly, and do  not touch it during use. Throw out disposable facemasks and gloves after using them. Do not reuse. Wash your hands immediately after removing your facemask and gloves. If your personal clothing becomes contaminated, carefully remove clothing and launder. Wash your hands after handling contaminated clothing. Place all used disposable facemasks, gloves, and other waste in a lined container before disposing them with other household waste. Remove gloves and wash your hands immediately after handling these items.  Do not share dishes, glasses, or other household items with the patient Avoid sharing household items. You should not share dishes, drinking glasses, cups, eating utensils, towels, bedding, or other items with a patient who is confirmed to have, or being evaluated for, COVID-19 infection. After the person uses these items, you should wash them thoroughly with soap and water.  Wash laundry thoroughly Immediately remove and wash clothes or bedding that have blood, body fluids, and/or secretions or excretions, such as sweat, saliva, sputum, nasal mucus, vomit, urine, or feces, on them. Wear gloves when handling laundry from the patient. Read and follow directions on labels of laundry or clothing items and detergent. In general, wash and dry with the warmest temperatures recommended on the label.  Clean all areas the individual has used often Clean all touchable surfaces, such as counters, tabletops, doorknobs, bathroom fixtures, toilets, phones,  keyboards, tablets, and bedside tables, every day. Also, clean any surfaces that may have blood, body fluids, and/or secretions or excretions on them. Wear gloves when cleaning surfaces the patient has come in contact with. Use a diluted bleach solution (e.g., dilute bleach with 1 part bleach and 10 parts water) or a household disinfectant with a label that says EPA-registered for coronaviruses. To make a bleach solution at home, add 1 tablespoon of  bleach to 1 quart (4 cups) of water. For a larger supply, add  cup of bleach to 1 gallon (16 cups) of water. Read labels of cleaning products and follow recommendations provided on product labels. Labels contain instructions for safe and effective use of the cleaning product including precautions you should take when applying the product, such as wearing gloves or eye protection and making sure you have good ventilation during use of the product. Remove gloves and wash hands immediately after cleaning.  Monitor yourself for signs and symptoms of illness Caregivers and household members are considered close contacts, should monitor their health, and will be asked to limit movement outside of the home to the extent possible. Follow the monitoring steps for close contacts listed on the symptom monitoring form.   ? If you have additional questions, contact your local health department or call the epidemiologist on call at [phone] (available 24/7). ? This guidance is subject to change. For the most up-to-date guidance from [facility], please refer to their website: TripMetro.huhttps://www.cdc.gov/coronavirus/2019-ncov/hcp/guidance-prevent-spread.html

## 2019-02-01 NOTE — ED Triage Notes (Addendum)
Headache, chills and chest soreness started last night.  Unknown fever, no cough Patient has abdominal pain, no nausea, no vomiting, no diarrhea

## 2019-02-02 ENCOUNTER — Telehealth: Payer: Self-pay

## 2019-02-02 DIAGNOSIS — Z20822 Contact with and (suspected) exposure to covid-19: Secondary | ICD-10-CM

## 2019-02-02 NOTE — Telephone Encounter (Signed)
-----   Message from Janith Lima, PA-C sent at 02/01/2019  6:04 PM EDT ----- Regarding: COVID testing Fever, headache, bodyaches

## 2019-02-02 NOTE — ED Provider Notes (Signed)
[place]    CSN: [id number] Arrival date & time: 02/01/19  1642      History   Chief Complaint Chief Complaint  Patient presents with  . Headache  . Chills    HPI Deanna Castaneda is a 28 y.o. female history of asthma presenting today for evaluation of headache and chills.  Patient states that beginning last night she began to have a headache, subjective fevers and chills.  She denies any respiratory symptoms of cough congestion or sore throat.  Denies nausea vomiting or diarrhea.  She has had some lower abdominal pain, but has attributed this to menstrual cramping as she is currently on her menstrual cycle.  States that this is difficult for her.  She just recently started oral contraceptives.  She has had some slight chest soreness, but denies difficulty breathing or chest pain.  She has not taken anything for her symptoms.  HPI  Past Medical History:  Diagnosis Date  . Asthma   . Sickle cell trait ([place])     There are no active problems to display for this patient.   Past Surgical History:  Procedure Laterality Date  . CHOLECYSTECTOMY      OB History   No obstetric history on file.      Home Medications    Prior to Admission medications   Medication Sig Start Date End Date Taking? Authorizing Provider  NON FORMULARY Birth control pill   Yes [provider]  albuterol (VENTOLIN HFA) 108 (90 Base) MCG/ACT inhaler Inhale 1 puff into the lungs every 6 (six) hours as needed for wheezing or shortness of breath.    [provider]  cyclobenzaprine (FLEXERIL) 10 MG tablet Take 1 tablet (10 mg total) by mouth 2 (two) times daily as needed for muscle spasms. 12/18/18   Loura Halt A, NP  ibuprofen (ADVIL) 600 MG tablet Take 1 tablet (600 mg total) by mouth every 6 (six) hours as needed. 02/01/19   Wieters, Hallie C, PA-C  naproxen (NAPROSYN) 500 MG tablet Take 1 tablet (500 mg total) by mouth 2 (two) times daily. 12/18/18   Orvan July, NP     Family History Family History  Problem Relation Age of Onset  . Healthy Mother   . Sleep apnea Father     Social History Social History   Tobacco Use  . Smoking status: Never Smoker  . Smokeless tobacco: Never Used  Substance Use Topics  . Alcohol use: No  . Drug use: No     Allergies   Patient has no known allergies.   Review of Systems Review of Systems  Constitutional: Positive for chills, fatigue and fever. Negative for activity change and appetite change.  HENT: Negative for congestion, ear pain, rhinorrhea, sinus pressure, sore throat and trouble swallowing.   Eyes: Negative for discharge and redness.  Respiratory: Negative for cough, chest tightness and shortness of breath.   Cardiovascular: Negative for chest pain.  Gastrointestinal: Negative for abdominal pain, diarrhea, nausea and vomiting.  Musculoskeletal: Negative for myalgias.  Skin: Negative for rash.  Neurological: Positive for headaches. Negative for dizziness and light-headedness.     Physical Exam Triage Vital Signs ED Triage Vitals  Enc Vitals Group     BP 02/01/19 1732 126/83     Pulse Rate 02/01/19 1732 (!) 116     Resp 02/01/19 1732 (!) 22     Temp 02/01/19 1732 (!) 102.9 F (39.4 C)     Temp Source 02/01/19 1732 Oral  SpO2 02/01/19 1732 97 %     Weight --      Height --      Head Circumference --      Peak Flow --      Pain Score 02/01/19 1728 10     Pain Loc --      Pain Edu? --      Excl. in GC? --    No data found.  Updated Vital Signs BP 126/83 (BP Location: Right Arm) Comment (BP Location): regular forearm  Pulse (!) 116   Temp (!) 102.9 F (39.4 C) (Oral)   Resp (!) 22   LMP 02/01/2019   SpO2 97%   Visual Acuity Right Eye Distance:   Left Eye Distance:   Bilateral Distance:    Right Eye Near:   Left Eye Near:    Bilateral Near:     Physical Exam Vitals signs and nursing note reviewed.  Constitutional:      General: She is not in acute distress.     Appearance: She is well-developed.     Comments: Lying on bed, appears uncomfortable  HENT:     Head: Normocephalic and atraumatic.  Eyes:     Conjunctiva/sclera: Conjunctivae normal.  Neck:     Musculoskeletal: Neck supple.     Comments: Full active range of motion of neck Cardiovascular:     Rate and Rhythm: Normal rate and regular rhythm.     Heart sounds: No murmur.  Pulmonary:     Effort: Pulmonary effort is normal. No respiratory distress.     Breath sounds: Normal breath sounds.     Comments: Breathing comfortably at rest, CTABL, no wheezing, rales or other adventitious sounds auscultated  Abdominal:     Palpations: Abdomen is soft.     Tenderness: There is no abdominal tenderness.     Comments: Soft, nondistended, nontender to light and deep palpation throughout entire abdomen  Skin:    General: Skin is warm and dry.  Neurological:     Mental Status: She is alert.      UC Treatments / Results  Labs (all labs ordered are listed, but only abnormal results are displayed) Labs Reviewed - No data to display  EKG   Radiology No results found.  Procedures Procedures (including critical care time)  Medications Ordered in UC Medications  acetaminophen (TYLENOL) tablet 650 mg (650 mg Oral Given 02/01/19 1742)  acetaminophen (TYLENOL) 325 MG tablet (has no administration in time range)    Initial Impression / Assessment and Plan / UC Course  I have reviewed the triage vital signs and the nursing notes.  Pertinent labs & imaging results that were available during my care of the patient were reviewed by me and considered in my medical decision making (see chart for details).     Patient with 1 day of fever, tachycardia, headache, no associated respiratory symptoms at this time.  No abdominal tenderness.  Most likely viral illness, suspicious for COVID.  [facility]  Recommended quarantining until returning results.  Tylenol and  ibuprofen for fever and headache.  Push fluids, rest.  Follow-up if symptoms progressing or changing.  No nuchal rigidity at this time.  Monitor headache.  Likely secondary to fever.Discussed strict return precautions. Patient verbalized understanding and is agreeable with plan.  Final Clinical Impressions(s) / UC Diagnoses   Final diagnoses:  Suspected Covid-19 Virus Infection  Fever, unspecified     Discharge Instructions  Someone will be in contact with you for COVID testing [address]  Use anti-inflammatories for fever/headache/pain/swelling. You may take up to 800 mg Ibuprofen every 8 hours with food. You may supplement Ibuprofen with Tylenol [phone] mg every 8 hours.   Rest and drink plenty of fluids  Follow up if symptoms not resolving, worsening, changing, developing difficulty breathing, shortness of breath     Person Under Monitoring Name: Larena SoxDybianca Struckman  Location: [address] [place] [id number]   Infection Prevention Recommendations for Individuals Confirmed to have, or Being Evaluated for, 2019 Novel Coronavirus (COVID-19) Infection Who Receive Care at Home  Individuals who are confirmed to have, or are being evaluated for, COVID-19 should follow the prevention steps below until a healthcare provider or local or state health department says they can return to normal activities.  Stay home except to get medical care You should restrict activities outside your home, except for getting medical care. Do not go to work, school, or public areas, and do not use public transportation or taxis.  Call ahead before visiting your doctor Before your medical appointment, call the healthcare provider and tell them that you have, or are being evaluated for, COVID-19 infection. This will help the healthcare provider's office take steps to keep other people from getting infected. Ask your healthcare provider to call the local or state health department.   Monitor your symptoms Seek prompt medical attention if your illness is worsening (e.g., difficulty breathing). Before going to your medical appointment, call the healthcare provider and tell them that you have, or are being evaluated for, COVID-19 infection. Ask your healthcare provider to call the local or state health department.  Wear a facemask You should wear a facemask that covers your nose and mouth when you are in the same room with other people and when you visit a healthcare provider. People who live with or visit you should also wear a facemask while they are in the same room with you.  Separate yourself from other people in your home As much as possible, you should stay in a different room from other people in your home. Also, you should use a separate bathroom, if available.  Avoid sharing household items You should not share dishes, drinking glasses, cups, eating utensils, towels, bedding, or other items with other people in your home. After using these items, you should wash them thoroughly with soap and water.  Cover your coughs and sneezes Cover your mouth and nose with a tissue when you cough or sneeze, or you can cough or sneeze into your sleeve. Throw used tissues in a lined trash can, and immediately wash your hands with soap and water for at least 20 seconds or use an alcohol-based hand rub.  Wash your Union Pacific Corporationhands Wash your hands often and thoroughly with soap and water for at least 20 seconds. You can use an alcohol-based hand sanitizer if soap and water are not available and if your hands are not visibly dirty. Avoid touching your eyes, nose, and mouth with unwashed hands.   Prevention Steps for Caregivers and Household Members of Individuals Confirmed to have, or Being Evaluated for, COVID-19 Infection Being Cared for in the Home  If you live with, or provide care at home for, a person confirmed to have, or being evaluated for, COVID-19 infection please follow  these guidelines to prevent infection:  Follow healthcare provider's instructions Make sure that you understand and can help the patient follow any healthcare provider instructions for  all care.  Provide for the patient's basic needs You should help the patient with basic needs in the home and provide support for getting groceries, prescriptions, and other personal needs.  Monitor the patient's symptoms If they are getting sicker, call his or her medical provider and tell them that the patient has, or is being evaluated for, COVID-19 infection. This will help the healthcare provider's office take steps to keep other people from getting infected. Ask the healthcare provider to call the local or state health department.  Limit the number of people who have contact with the patient  If possible, have only one caregiver for the patient.  Other household members should stay in another home or place of residence. If this is not possible, they should stay  in another room, or be separated from the patient as much as possible. Use a separate bathroom, if available.  Restrict visitors who do not have an essential need to be in the home.  Keep older adults, very young children, and other sick people away from the patient Keep older adults, very young children, and those who have compromised immune systems or chronic health conditions away from the patient. This includes people with chronic heart, lung, or kidney conditions, diabetes, and cancer.  Ensure good ventilation Make sure that shared spaces in the home have good air flow, such as from an air conditioner or an opened window, weather permitting.  Wash your hands often  Wash your hands often and thoroughly with soap and water for at least 20 seconds. You can use an alcohol based hand sanitizer if soap and water are not available and if your hands are not visibly dirty.  Avoid touching your eyes, nose, and mouth with unwashed hands.   Use disposable paper towels to dry your hands. If not available, use dedicated cloth towels and replace them when they become wet.  Wear a facemask and gloves  Wear a disposable facemask at all times in the room and gloves when you touch or have contact with the patient's blood, body fluids, and/or secretions or excretions, such as sweat, saliva, sputum, nasal mucus, vomit, urine, or feces.  Ensure the mask fits over your nose and mouth tightly, and do not touch it during use.  Throw out disposable facemasks and gloves after using them. Do not reuse.  Wash your hands immediately after removing your facemask and gloves.  If your personal clothing becomes contaminated, carefully remove clothing and launder. Wash your hands after handling contaminated clothing.  Place all used disposable facemasks, gloves, and other waste in a lined container before disposing them with other household waste.  Remove gloves and wash your hands immediately after handling these items.  Do not share dishes, glasses, or other household items with the patient  Avoid sharing household items. You should not share dishes, drinking glasses, cups, eating utensils, towels, bedding, or other items with a patient who is confirmed to have, or being evaluated for, COVID-19 infection.  After the person uses these items, you should wash them thoroughly with soap and water.  Wash laundry thoroughly  Immediately remove and wash clothes or bedding that have blood, body fluids, and/or secretions or excretions, such as sweat, saliva, sputum, nasal mucus, vomit, urine, or feces, on them.  Wear gloves when handling laundry from the patient.  Read and follow directions on labels of laundry or clothing items and detergent. In general, wash and dry with the warmest temperatures recommended on the label.  Clean all areas  the individual has used often  Clean all touchable surfaces, such as counters, tabletops, doorknobs, bathroom  fixtures, toilets, phones, keyboards, tablets, and bedside tables, every day. Also, clean any surfaces that may have blood, body fluids, and/or secretions or excretions on them.  Wear gloves when cleaning surfaces the patient has come in contact with.  Use a diluted bleach solution (e.g., dilute bleach with 1 part bleach and 10 parts water) or a household disinfectant with a label that says EPA-registered for coronaviruses. To make a bleach solution at home, add 1 tablespoon of bleach to 1 quart (4 cups) of water. For a larger supply, add  cup of bleach to 1 gallon (16 cups) of water.  Read labels of cleaning products and follow recommendations provided on product labels. Labels contain instructions for safe and effective use of the cleaning product including precautions you should take when applying the product, such as wearing gloves or eye protection and making sure you have good ventilation during use of the product.  Remove gloves and wash hands immediately after cleaning.  Monitor yourself for signs and symptoms of illness Caregivers and household members are considered close contacts, should monitor their health, and will be asked to limit movement outside of the home to the extent possible. Follow the monitoring steps for close contacts listed on the symptom monitoring form.   ? If you have additional questions, contact your local health department or call the epidemiologist on call at [phone] (available 24/7). ? This guidance is subject to change. For the most up-to-date guidance from [facility], please refer to their website: TripMetro.huhttps://www.cdc.gov/coronavirus/2019-ncov/hcp/guidance-prevent-spread.html    ED Prescriptions    Medication Sig Dispense Auth. Provider   ibuprofen (ADVIL) 600 MG tablet Take 1 tablet (600 mg total) by mouth every 6 (six) hours as needed. 30 tablet Wieters, EarlhamHallie C, PA-C     Controlled Substance Prescriptions [place] Controlled Substance Registry consulted?  Not Applicable   Lew DawesWieters, Hallie C, [place]-C 02/02/19 0730

## 2019-02-02 NOTE — Telephone Encounter (Signed)
Phone call to pt. To schedule for COVID testing.  Appt. Given on 7/6 @ 1:00 PM @ [facility].  Advised to wear a mask and to remain in the car for testing.  Verb. Understanding.

## 2019-02-05 ENCOUNTER — Other Ambulatory Visit: Payer: BLUE CROSS/BLUE SHIELD

## 2019-02-05 DIAGNOSIS — Z20822 Contact with and (suspected) exposure to covid-19: Secondary | ICD-10-CM

## 2019-02-10 LAB — NOVEL CORONAVIRUS, NAA: SARS-CoV-2, NAA: NOT DETECTED

## 2019-02-13 ENCOUNTER — Telehealth (HOSPITAL_COMMUNITY): Payer: Self-pay | Admitting: Emergency Medicine

## 2019-02-13 NOTE — Telephone Encounter (Signed)
Your test for COVID-19 was negative.  Please continue good preventive care measures, including:  frequent hand-washing, avoid touching your face, cover coughs/sneezes, stay out of crowds and keep a 6 foot distance from others.  If you develop fever/cough/breathlessness, please stay home for 10 days and until you have had 3 consecutive days with cough/breathlessness improving and without fever (without taking a fever reducer). Go to the nearest hospital ED tent for assessment if fever/cough/breathlessness are severe or illness seems like a threat to life..  Patient contacted and made aware of all results, all questions answered.  

## 2019-03-04 ENCOUNTER — Ambulatory Visit (INDEPENDENT_AMBULATORY_CARE_PROVIDER_SITE_OTHER): Payer: BLUE CROSS/BLUE SHIELD

## 2019-03-04 ENCOUNTER — Other Ambulatory Visit: Payer: Self-pay

## 2019-03-04 ENCOUNTER — Ambulatory Visit (HOSPITAL_COMMUNITY)
Admission: EM | Admit: 2019-03-04 | Discharge: 2019-03-04 | Disposition: A | Discharge status: Home / Self Care | Payer: BLUE CROSS/BLUE SHIELD | Attending: Family Medicine | Admitting: Family Medicine

## 2019-03-04 DIAGNOSIS — R0789 Other chest pain: Secondary | ICD-10-CM

## 2019-03-04 DIAGNOSIS — S20212A Contusion of left front wall of thorax, initial encounter: Secondary | ICD-10-CM | POA: Diagnosis not present

## 2019-03-04 MED ORDER — HYDROCODONE-ACETAMINOPHEN 5-325 MG PO TABS: 1.0000 | ORAL_TABLET | Freq: Four times a day (QID) | ORAL | 0 refills | Status: DC | PRN
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
## 2020-07-15 ENCOUNTER — Emergency Department (HOSPITAL_COMMUNITY)
Admission: EM | Admit: 2020-07-15 | Discharge: 2020-07-15 | Disposition: A | Discharge status: Home / Self Care | Payer: BLUE CROSS/BLUE SHIELD | Attending: Emergency Medicine | Admitting: Emergency Medicine

## 2020-07-15 ENCOUNTER — Encounter (HOSPITAL_COMMUNITY): Payer: Self-pay

## 2020-07-15 ENCOUNTER — Other Ambulatory Visit: Payer: Self-pay

## 2020-07-15 DIAGNOSIS — J45909 Unspecified asthma, uncomplicated: Secondary | ICD-10-CM | POA: Insufficient documentation

## 2020-07-15 DIAGNOSIS — N750 Cyst of Bartholin's gland: Secondary | ICD-10-CM | POA: Diagnosis not present

## 2020-07-15 MED ORDER — OXYCODONE-ACETAMINOPHEN 5-325 MG PO TABS: 1.0000 | ORAL_TABLET | Freq: Four times a day (QID) | ORAL | 0 refills | Status: DC | PRN

## 2020-07-15 MED ORDER — OXYCODONE-ACETAMINOPHEN 5-325 MG PO TABS
1.0000 | ORAL_TABLET | Freq: Once | ORAL | Status: AC
  Administered 2020-07-15: 1 via ORAL
  Filled 2020-07-15: qty 1

## 2020-07-15 NOTE — Discharge Instructions (Addendum)
[facility] [address] [place],    Phone Number [phone]  I also want you to try doing sitz bath's, bathtub full of warm water 1 cup of Epson salt soak for at least 15 minutes, try to do this at least once a day  For pain control use Tylenol Motrin as described, use the narcotic for breakthrough pain. You can take 600 mg of ibuprofen every 6 hours, you can take 1000 mg of Tylenol every 6 hours, you can alternate these every 3 or you can take them together.

## 2020-07-15 NOTE — ED Triage Notes (Signed)
Pt reports a Bartholin gland Cyst on vagina. Pain increasing over last 2 days.

## 2020-07-15 NOTE — ED Provider Notes (Signed)
[facility]-EMERGENCY DEPT Provider Note   CSN: [id number] Arrival date & time: 07/15/20  0347     History Chief Complaint  Patient presents with  . Abscess    Deanna Castaneda is a 29 y.o. female.   Abscess Location:  Pelvis Pelvic abscess location:  Vulva Abscess quality: induration, painful and redness   Duration:  1 week Progression:  Worsening Chronicity:  New Relieved by:  Nothing Worsened by:  Nothing Ineffective treatments:  None tried Associated symptoms: no fever, no headaches, no nausea and no vomiting        Past Medical History:  Diagnosis Date  . Asthma   . Sickle cell trait (HCC)     There are no problems to display for this patient.   Past Surgical History:  Procedure Laterality Date  . CHOLECYSTECTOMY       OB History   No obstetric history on file.     Family History  Problem Relation Age of Onset  . Healthy Mother   . Sleep apnea Father     Social History   Tobacco Use  . Smoking status: Never Smoker  . Smokeless tobacco: Never Used  Vaping Use  . Vaping Use: Never used  Substance Use Topics  . Alcohol use: No  . Drug use: No    Home Medications Prior to Admission medications   Medication Sig Start Date End Date Taking? Authorizing Provider  albuterol (VENTOLIN HFA) 108 (90 Base) MCG/ACT inhaler Inhale 1 puff into the lungs every 6 (six) hours as needed for wheezing or shortness of breath.    [provider]  HYDROcodone-acetaminophen (NORCO) 5-325 MG tablet Take 1 tablet by mouth every 6 (six) hours as needed for moderate pain. 03/04/19   Elvina Sidle, MD  ibuprofen (ADVIL) 600 MG tablet Take 1 tablet (600 mg total) by mouth every 6 (six) hours as needed. 02/01/19   Wieters, Hallie C, PA-C  NON FORMULARY Birth control pill    [provider]  oxyCODONE-acetaminophen (PERCOCET/ROXICET) 5-325 MG tablet Take 1 tablet by mouth every 6 (six) hours as needed for up to 12 doses for severe  pain. 07/15/20   Sabino Donovan, MD    Allergies    Patient has no known allergies.  Review of Systems   Review of Systems  Constitutional: Negative for chills and fever.  HENT: Negative for congestion and rhinorrhea.   Respiratory: Negative for cough and shortness of breath.   Cardiovascular: Negative for chest pain and palpitations.  Gastrointestinal: Negative for diarrhea, nausea and vomiting.  Genitourinary: Positive for pelvic pain and vaginal pain. Negative for difficulty urinating, dysuria, vaginal bleeding and vaginal discharge.  Musculoskeletal: Negative for arthralgias and back pain.  Skin: Negative for rash and wound.  Neurological: Negative for light-headedness and headaches.    Physical Exam Updated Vital Signs BP 126/77 (BP Location: Left Arm)   Pulse 98   Temp 99.1 F (37.3 C) (Oral)   Resp 18   Ht 5\' 2"  (1.575 m)   Wt 99.8 kg   SpO2 100%   BMI 40.24 kg/m   Physical Exam Vitals and nursing note reviewed. Exam conducted with a chaperone present.  Constitutional:      General: She is not in acute distress.    Appearance: Normal appearance.  HENT:     Head: Normocephalic and atraumatic.     Nose: No rhinorrhea.  Eyes:     General:        Right eye: No discharge.  Left eye: No discharge.     Conjunctiva/sclera: Conjunctivae normal.  Cardiovascular:     Rate and Rhythm: Normal rate and regular rhythm.  Pulmonary:     Effort: Pulmonary effort is normal. No respiratory distress.     Breath sounds: No stridor.  Abdominal:     General: Abdomen is flat. There is no distension.     Palpations: Abdomen is soft.  Genitourinary:   Musculoskeletal:        General: No tenderness or signs of injury.  Skin:    General: Skin is warm and dry.  Neurological:     General: No focal deficit present.     Mental Status: She is alert. Mental status is at baseline.     Motor: No weakness.  Psychiatric:        Mood and Affect: Mood normal.        Behavior:  Behavior normal.     ED Results / Procedures / Treatments   Labs (all labs ordered are listed, but only abnormal results are displayed) Labs Reviewed - No data to display  EKG None  Radiology No results found.  Procedures Procedures (including critical care time)  Medications Ordered in ED Medications  oxyCODONE-acetaminophen (PERCOCET/ROXICET) 5-325 MG per tablet 1 tablet (1 tablet Oral Given 07/15/20 0546)    ED Course  I have reviewed the triage vital signs and the nursing notes.  Pertinent labs & imaging results that were available during my care of the patient were reviewed by me and considered in my medical decision making (see chart for details).    MDM Rules/Calculators/A&P                          Patient reports resolving symptoms of Bartholin gland cyst, on exam very difficult to feel any fluctuance, indurated area feels very small.  I recommend outpatient follow-up sitz bath's, and return with worsening symptoms.  Patient still having significant pain from it though, I offered pelvic exam for full evaluation of the genitalia and she declined, states she does not need STI testing as she is recently had this done.  She has no systemic signs of illness.  Pain control is provided discharged with outpatient follow-up to OB/GYN care.  Final Clinical Impression(s) / ED Diagnoses Final diagnoses:  Bartholin gland cyst    Rx / DC Orders ED Discharge Orders         Ordered    oxyCODONE-acetaminophen (PERCOCET/ROXICET) 5-325 MG tablet  Every 6 hours PRN        07/15/20 0801           Sabino Donovan, MD 07/15/20 [phone]

## 2020-08-08 ENCOUNTER — Other Ambulatory Visit: Payer: BLUE CROSS/BLUE SHIELD

## 2020-08-08 DIAGNOSIS — Z20822 Contact with and (suspected) exposure to covid-19: Secondary | ICD-10-CM

## 2020-08-10 LAB — NOVEL CORONAVIRUS, NAA: SARS-CoV-2, NAA: NOT DETECTED

## 2020-08-10 LAB — SARS-COV-2, NAA 2 DAY TAT

## 2020-11-15 ENCOUNTER — Encounter (HOSPITAL_COMMUNITY): Payer: Self-pay | Admitting: Emergency Medicine

## 2020-11-15 ENCOUNTER — Other Ambulatory Visit: Payer: Self-pay

## 2020-11-15 ENCOUNTER — Ambulatory Visit (HOSPITAL_COMMUNITY)
Admission: EM | Admit: 2020-11-15 | Discharge: 2020-11-15 | Disposition: A | Discharge status: Home / Self Care | Payer: BLUE CROSS/BLUE SHIELD | Attending: Medical Oncology | Admitting: Medical Oncology

## 2020-11-15 DIAGNOSIS — Z79899 Other long term (current) drug therapy: Secondary | ICD-10-CM | POA: Insufficient documentation

## 2020-11-15 DIAGNOSIS — J4521 Mild intermittent asthma with (acute) exacerbation: Secondary | ICD-10-CM

## 2020-11-15 DIAGNOSIS — R519 Headache, unspecified: Secondary | ICD-10-CM | POA: Insufficient documentation

## 2020-11-15 DIAGNOSIS — R0602 Shortness of breath: Secondary | ICD-10-CM | POA: Insufficient documentation

## 2020-11-15 DIAGNOSIS — R6883 Chills (without fever): Secondary | ICD-10-CM | POA: Insufficient documentation

## 2020-11-15 DIAGNOSIS — R6889 Other general symptoms and signs: Secondary | ICD-10-CM

## 2020-11-15 DIAGNOSIS — Z20822 Contact with and (suspected) exposure to covid-19: Secondary | ICD-10-CM | POA: Insufficient documentation

## 2020-11-15 LAB — SARS CORONAVIRUS 2 (TAT 6-24 HRS): SARS Coronavirus 2: NEGATIVE

## 2020-11-15 MED ORDER — FLUTICASONE PROPIONATE 50 MCG/ACT NA SUSP: 2.0000 | Freq: Every day | NASAL | 0 refills | Status: AC

## 2020-11-15 MED ORDER — OSELTAMIVIR PHOSPHATE 75 MG PO CAPS: 75.0000 mg | ORAL_CAPSULE | Freq: Two times a day (BID) | ORAL | 0 refills | Status: DC

## 2020-11-15 MED ORDER — BENZONATATE 100 MG PO CAPS: 100.0000 mg | ORAL_CAPSULE | Freq: Three times a day (TID) | ORAL | 0 refills | Status: DC

## 2020-11-15 NOTE — ED Triage Notes (Signed)
Pt is present today with chills, SOB, cough, and headache. Pt states that her [place] started two days ago.

## 2020-11-15 NOTE — ED Provider Notes (Signed)
[facility]    CSN: [id number] Arrival date & time: 11/15/20  1310     History   Chief Complaint Chief Complaint  Patient presents with  . Cough  . Chills  . Shortness of Breath  . Headache    HPI Deanna Castaneda is a 30 y.o. female.   HPI  Cold Symptoms: Pt reports symptoms of dry cough, chills, SOB and headache for the past 2 days. Has felt feverish. She does have a history of asthma with some wheezing. She has tried rest and fluids for symptoms. Close family contact with influenza sick contacts. Mild chest pain with deep breathing but denies any at rest.   Past Medical History:  Diagnosis Date  . Asthma   . Sickle cell trait (HCC)     There are no problems to display for this patient.   Past Surgical History:  Procedure Laterality Date  . CHOLECYSTECTOMY      OB History   No obstetric history on file.      Home Medications    Prior to Admission medications   Medication Sig Start Date End Date Taking? Authorizing Provider  benzonatate (TESSALON) 100 MG capsule Take 1 capsule (100 mg total) by mouth every 8 (eight) hours. 11/15/20  Yes Kasiah Manka M, PA-C  fluticasone [facility]) 50 MCG/ACT nasal spray Place 2 sprays into both nostrils daily. 11/15/20  Yes Sloan Takagi M, PA-C  oseltamivir (TAMIFLU) 75 MG capsule Take 1 capsule (75 mg total) by mouth every 12 (twelve) hours. 11/15/20  Yes Marshel Golubski M, PA-C  albuterol (VENTOLIN HFA) 108 (90 Base) MCG/ACT inhaler Inhale 1 puff into the lungs every 6 (six) hours as needed for wheezing or shortness of breath.    [provider]  NON FORMULARY Birth control pill    [provider]    Family History Family History  Problem Relation Age of Onset  . Healthy Mother   . Sleep apnea Father     Social History Social History   Tobacco Use  . Smoking status: Never Smoker  . Smokeless tobacco: Never Used  Vaping Use  . Vaping Use: Never used  Substance Use Topics  .  Alcohol use: No  . Drug use: No     Allergies   Patient has no known allergies.   Review of Systems Review of Systems  As stated above in HPI Physical Exam Triage Vital Signs ED Triage Vitals  Enc Vitals Group     BP 11/15/20 1337 112/76     Pulse Rate 11/15/20 1337 71     Resp 11/15/20 1337 17     Temp 11/15/20 1337 (!) 97.1 F (36.2 C)     Temp Source 11/15/20 1337 Temporal     SpO2 11/15/20 1337 96 %     Weight --      Height --      Head Circumference --      Peak Flow --      Pain Score 11/15/20 1335 9     Pain Loc --      Pain Edu? --      Excl. in GC? --    No data found.  Updated Vital Signs BP 112/76 (BP Location: Right Arm)   Pulse 71   Temp (!) 97.1 F (36.2 C) (Temporal)   Resp 17   LMP 11/08/2020   SpO2 96%   Physical Exam Vitals and nursing note reviewed.  Constitutional:      General: She is  not in acute distress.    Appearance: She is well-developed. She is obese. She is ill-appearing. She is not toxic-appearing or diaphoretic.  HENT:     Head: Normocephalic and atraumatic.     Mouth/Throat:     Mouth: Mucous membranes are moist.     Pharynx: Oropharynx is clear. No pharyngeal swelling or oropharyngeal exudate.  Eyes:     Extraocular Movements: Extraocular movements intact.     Pupils: Pupils are equal, round, and reactive to light.  Cardiovascular:     Rate and Rhythm: Normal rate and regular rhythm.  Pulmonary:     Effort: Pulmonary effort is normal.     Breath sounds: Normal breath sounds. No decreased breath sounds, wheezing, rhonchi or rales.  Chest:     Chest wall: No tenderness.  Musculoskeletal:     Cervical back: Normal range of motion and neck supple.  Lymphadenopathy:     Cervical: No cervical adenopathy.  Skin:    General: Skin is warm.     Coloration: Skin is not pale.     Findings: No rash.  Neurological:     General: No focal deficit present.     Mental Status: She is alert and oriented to person, place, and  time.     Motor: No weakness.      UC Treatments / Results  Labs (all labs ordered are listed, but only abnormal results are displayed) Labs Reviewed  SARS CORONAVIRUS 2 (TAT 6-24 HRS)    EKG   Radiology No results found.  Procedures Procedures (including critical care time)  Medications Ordered in UC Medications - No data to display  Initial Impression / Assessment and Plan / UC Course  I have reviewed the triage vital signs and the nursing notes.  Pertinent labs & imaging results that were available during my care of the patient were reviewed by me and considered in my medical decision making (see chart for details).     New. Flu like symptoms with influenza close contact. Given history of asthma will treat with Tamiflu. Discussed. She states that she had an albuterol inhaler on hand to use PRN. She will rest, stay hydrated and follow up for worsening symptoms.  Final Clinical Impressions(s) / UC Diagnoses   Final diagnoses:  Flu-like symptoms  Mild intermittent asthma with acute exacerbation   Discharge Instructions   None    ED Prescriptions    Medication Sig Dispense Auth. Provider   oseltamivir (TAMIFLU) 75 MG capsule Take 1 capsule (75 mg total) by mouth every 12 (twelve) hours. 10 capsule Jettie Lazare M, PA-C   benzonatate (TESSALON) 100 MG capsule Take 1 capsule (100 mg total) by mouth every 8 (eight) hours. 21 capsule Arath Kaigler M, PA-C   fluticasone [facility]) 50 MCG/ACT nasal spray Place 2 sprays into both nostrils daily. 16 mL Rushie Chestnut, [place]     PDMP not reviewed this encounter.   Rushie Chestnut, [place] 11/15/20 1441

## 2021-02-08 ENCOUNTER — Encounter (HOSPITAL_COMMUNITY): Payer: Self-pay | Admitting: *Deleted

## 2021-02-08 ENCOUNTER — Other Ambulatory Visit: Payer: Self-pay

## 2021-02-08 ENCOUNTER — Ambulatory Visit (HOSPITAL_COMMUNITY)
Admission: EM | Admit: 2021-02-08 | Discharge: 2021-02-08 | Disposition: A | Discharge status: Home / Self Care | Payer: Medicaid Other | Attending: Emergency Medicine | Admitting: Emergency Medicine

## 2021-02-08 DIAGNOSIS — M545 Low back pain, unspecified: Secondary | ICD-10-CM

## 2021-02-08 DIAGNOSIS — M79672 Pain in left foot: Secondary | ICD-10-CM

## 2021-02-08 DIAGNOSIS — M79621 Pain in right upper arm: Secondary | ICD-10-CM

## 2021-02-08 DIAGNOSIS — M79671 Pain in right foot: Secondary | ICD-10-CM

## 2021-02-08 DIAGNOSIS — R519 Headache, unspecified: Secondary | ICD-10-CM

## 2021-02-08 LAB — POCT URINALYSIS DIPSTICK, ED / UC
Bilirubin Urine: NEGATIVE
Glucose, UA: NEGATIVE mg/dL
Hgb urine dipstick: NEGATIVE
Ketones, ur: NEGATIVE mg/dL
Leukocytes,Ua: NEGATIVE
Nitrite: NEGATIVE
Protein, ur: NEGATIVE mg/dL
Specific Gravity, Urine: 1.025 (ref 1.005–1.030)
Urobilinogen, UA: 0.2 mg/dL (ref 0.0–1.0)
pH: 6 (ref 5.0–8.0)

## 2021-02-08 LAB — POC URINE PREG, ED: Preg Test, Ur: NEGATIVE

## 2021-02-08 MED ORDER — MELOXICAM 7.5 MG PO TABS: 7.5000 mg | ORAL_TABLET | Freq: Every day | ORAL | 0 refills | Status: DC

## 2021-02-08 NOTE — ED Triage Notes (Signed)
Pt reports HA and bil foot pain for one week

## 2021-02-08 NOTE — Discharge Instructions (Addendum)
Take the Meloxicam daily to help with your back pain and bilateral foot pain.  Rest as much as possible and drink plenty of water.  Elevate your feet when you are sitting or laying to help with swelling.    You can take Tylenol as needed for pain, but try to avoid taking anything unless it is absolutely needed.    Return or go to the Emergency Department if symptoms worsen or do not improve in the next few days.

## 2021-02-08 NOTE — ED Provider Notes (Signed)
[facility]    CSN: [id number] Arrival date & time: 02/08/21  1423      History   Chief Complaint Chief Complaint  Patient presents with   Headache   Foot Pain    HPI Deanna Castaneda is a 30 y.o. female.   Patient here for evaluation of headache, back pain, and bilateral foot swelling/pain that has been ongoing for the past week.  Reports headache is frontal and does report some intermittent photosensitivity and nausea, denies any vomiting.  Reports taking Aleve with minimal relief.  Reports back pain is located in the mid lower back.  Pain is worse with movement and improves with rest.  Bilateral foot swelling and pain worse throughout the day and improves with rest and when elevated.  Denies any trauma, injury, or other precipitating event.  Denies any specific alleviating or aggravating factors.  Denies any fevers, chest pain, shortness of breath, N/V/D, numbness, tingling, weakness, abdominal pain, or headaches.     The history is provided by the patient.  Headache Associated symptoms: back pain, nausea and photophobia   Associated symptoms: no abdominal pain, no congestion, no cough, no fever and no sinus pressure   Foot Pain Associated symptoms include headaches. Pertinent negatives include no abdominal pain and no shortness of breath.   Past Medical History:  Diagnosis Date   Asthma    Sickle cell trait (HCC)     There are no problems to display for this patient.   Past Surgical History:  Procedure Laterality Date   CHOLECYSTECTOMY      OB History   No obstetric history on file.      Home Medications    Prior to Admission medications   Medication Sig Start Date End Date Taking? Authorizing Provider  meloxicam (MOBIC) 7.5 MG tablet Take 1 tablet (7.5 mg total) by mouth daily. 02/08/21  Yes Ivette Loyal, NP  albuterol (VENTOLIN HFA) 108 (90 Base) MCG/ACT inhaler Inhale 1 puff into the lungs every 6 (six) hours as needed for wheezing or shortness  of breath.    [provider]  benzonatate (TESSALON) 100 MG capsule Take 1 capsule (100 mg total) by mouth every 8 (eight) hours. 11/15/20   Rushie Chestnut, PA-C  fluticasone (FLONASE) 50 MCG/ACT nasal spray Place 2 sprays into both nostrils daily. 11/15/20   Rushie Chestnut, PA-C  NON FORMULARY Birth control pill    [provider]  oseltamivir (TAMIFLU) 75 MG capsule Take 1 capsule (75 mg total) by mouth every 12 (twelve) hours. 11/15/20   Rushie Chestnut, PA-C    Family History Family History  Problem Relation Age of Onset   Healthy Mother    Sleep apnea Father     Social History Social History   Tobacco Use   Smoking status: Never   Smokeless tobacco: Never  Vaping Use   Vaping Use: Never used  Substance Use Topics   Alcohol use: No   Drug use: No     Allergies   Patient has no known allergies.   Review of Systems Review of Systems  Constitutional:  Negative for fever.  HENT:  Negative for congestion, facial swelling, sinus pressure, sinus pain and tinnitus.   Eyes:  Positive for photophobia.  Respiratory:  Negative for cough and shortness of breath.   Cardiovascular:  Positive for leg swelling.  Gastrointestinal:  Positive for nausea. Negative for abdominal pain.  Genitourinary:  Negative for flank pain and urgency.  Musculoskeletal:  Positive for  back pain.  Neurological:  Positive for headaches.    Physical Exam Triage Vital Signs ED Triage Vitals  Enc Vitals Group     BP 02/08/21 1506 120/76     Pulse Rate 02/08/21 1506 88     Resp 02/08/21 1506 18     Temp 02/08/21 1506 98.3 F (36.8 C)     Temp src --      SpO2 02/08/21 1506 100 %     Weight --      Height --      Head Circumference --      Peak Flow --      Pain Score 02/08/21 1507 9     Pain Loc --      Pain Edu? --      Excl. in GC? --    No data found.  Updated Vital Signs BP 120/76   Pulse 88   Temp 98.3 F (36.8 C)   Resp 18   LMP 02/01/2021   SpO2  100%   Visual Acuity Right Eye Distance:   Left Eye Distance:   Bilateral Distance:    Right Eye Near:   Left Eye Near:    Bilateral Near:     Physical Exam Vitals and nursing note reviewed.  Constitutional:      General: She is not in acute distress.    Appearance: Normal appearance. She is not ill-appearing, toxic-appearing or diaphoretic.  HENT:     Head: Normocephalic and atraumatic.  Eyes:     Extraocular Movements: Extraocular movements intact.     Conjunctiva/sclera: Conjunctivae normal.     Pupils: Pupils are equal, round, and reactive to light.  Cardiovascular:     Rate and Rhythm: Normal rate.     Pulses: Normal pulses.     Heart sounds: Normal heart sounds.  Pulmonary:     Effort: Pulmonary effort is normal.     Breath sounds: Normal breath sounds.  Abdominal:     General: Abdomen is flat.  Musculoskeletal:        General: Normal range of motion.     Cervical back: Normal and normal range of motion.     Thoracic back: Normal.     Lumbar back: Tenderness present. No bony tenderness. Normal range of motion. Negative right straight leg raise test and negative left straight leg raise test.     Right lower leg: No edema.     Left lower leg: No edema.  Skin:    General: Skin is warm and dry.  Neurological:     General: No focal deficit present.     Mental Status: She is alert and oriented to person, place, and time.     GCS: GCS eye subscore is 4. GCS verbal subscore is 5. GCS motor subscore is 6.     Cranial Nerves: No cranial nerve deficit, dysarthria or facial asymmetry.     Sensory: No sensory deficit.     Motor: No weakness.     Coordination: Romberg sign negative. Coordination normal.     Gait: Gait normal.  Psychiatric:        Mood and Affect: Mood normal.     UC Treatments / Results  Labs (all labs ordered are listed, but only abnormal results are displayed) Labs Reviewed  POCT URINALYSIS DIPSTICK, ED / UC  POC URINE PREG, ED     EKG   Radiology No results found.  Procedures Procedures (including critical care time)  Medications Ordered in UC Medications -  No data to display  Initial Impression / Assessment and Plan / UC Course  I have reviewed the triage vital signs and the nursing notes.  Pertinent labs & imaging results that were available during my care of the patient were reviewed by me and considered in my medical decision making (see chart for details).    Assessment negative for red flags or concerns. Headache- possible rebound headache from analgesic overuse.  Recommend avoiding naproxen and ibuprofen temporarily.  May try Tylenol as needed but encouraged to avoid taking any OTC pain medication unless absolutely necessary.  Encouraged fluids and rest.  Acute back pain- urinalysis negative and urinary pregnancy test negative.  Likely musculoskeletal pain.  Will treat with meloxicam daily.  Rest and gentle stretching and exercises.   Bilateral foot pain and swelling- rest as much as possible and elevate feet when sitting or laying down.  Hopefully meloxicam daily with help with foot pain in addition to back pain.   Encouraged fluids and rest.  Follow up with primary care.  Final Clinical Impressions(s) / UC Diagnoses   Final diagnoses:  Acute nonintractable headache, unspecified headache type  Acute bilateral low back pain without sciatica  Bilateral foot pain     Discharge Instructions      Take the Meloxicam daily to help with your back pain and bilateral foot pain.  Rest as much as possible and drink plenty of water.  Elevate your feet when you are sitting or laying to help with swelling.    You can take Tylenol as needed for pain, but try to avoid taking anything unless it is absolutely needed.    Return or go to the Emergency Department if symptoms worsen or do not improve in the next few days.      ED Prescriptions     Medication Sig Dispense Auth. Provider   meloxicam  (MOBIC) 7.5 MG tablet Take 1 tablet (7.5 mg total) by mouth daily. 14 tablet Ivette Loyal, NP      PDMP not reviewed this encounter.   Ivette Loyal, NP 02/08/21 [phone]

## 2021-06-29 ENCOUNTER — Other Ambulatory Visit: Payer: Self-pay

## 2021-06-29 ENCOUNTER — Ambulatory Visit
Admission: EM | Admit: 2021-06-29 | Discharge: 2021-06-29 | Discharge status: Absent without leave | Payer: Medicaid Other

## 2021-07-14 ENCOUNTER — Other Ambulatory Visit: Payer: Self-pay

## 2021-07-14 ENCOUNTER — Ambulatory Visit (HOSPITAL_COMMUNITY)
Admission: EM | Admit: 2021-07-14 | Discharge: 2021-07-14 | Disposition: A | Discharge status: Home / Self Care | Payer: Self-pay | Attending: Physician Assistant | Admitting: Physician Assistant

## 2021-07-14 ENCOUNTER — Emergency Department (HOSPITAL_COMMUNITY)
Admission: EM | Admit: 2021-07-14 | Discharge: 2021-07-15 | Disposition: A | Discharge status: Home / Self Care | Payer: Self-pay | Attending: Emergency Medicine | Admitting: Emergency Medicine

## 2021-07-14 ENCOUNTER — Emergency Department (HOSPITAL_COMMUNITY): Payer: Self-pay

## 2021-07-14 ENCOUNTER — Encounter (HOSPITAL_COMMUNITY): Payer: Self-pay

## 2021-07-14 ENCOUNTER — Encounter (HOSPITAL_COMMUNITY): Payer: Self-pay | Admitting: Emergency Medicine

## 2021-07-14 ENCOUNTER — Ambulatory Visit (HOSPITAL_COMMUNITY): Payer: Self-pay

## 2021-07-14 DIAGNOSIS — R0981 Nasal congestion: Secondary | ICD-10-CM | POA: Insufficient documentation

## 2021-07-14 DIAGNOSIS — R0789 Other chest pain: Secondary | ICD-10-CM | POA: Insufficient documentation

## 2021-07-14 DIAGNOSIS — Z7951 Long term (current) use of inhaled steroids: Secondary | ICD-10-CM | POA: Insufficient documentation

## 2021-07-14 DIAGNOSIS — R0602 Shortness of breath: Secondary | ICD-10-CM

## 2021-07-14 DIAGNOSIS — J45909 Unspecified asthma, uncomplicated: Secondary | ICD-10-CM | POA: Insufficient documentation

## 2021-07-14 DIAGNOSIS — R079 Chest pain, unspecified: Secondary | ICD-10-CM

## 2021-07-14 DIAGNOSIS — R519 Headache, unspecified: Secondary | ICD-10-CM | POA: Insufficient documentation

## 2021-07-14 DIAGNOSIS — R112 Nausea with vomiting, unspecified: Secondary | ICD-10-CM | POA: Insufficient documentation

## 2021-07-14 DIAGNOSIS — Z20822 Contact with and (suspected) exposure to covid-19: Secondary | ICD-10-CM | POA: Insufficient documentation

## 2021-07-14 DIAGNOSIS — R531 Weakness: Secondary | ICD-10-CM

## 2021-07-14 DIAGNOSIS — R0600 Dyspnea, unspecified: Secondary | ICD-10-CM | POA: Insufficient documentation

## 2021-07-14 LAB — CBC
HCT: 35.1 % — ABNORMAL LOW (ref 36.0–46.0)
Hemoglobin: 12.3 g/dL (ref 12.0–15.0)
MCH: 27.7 pg (ref 26.0–34.0)
MCHC: 35 g/dL (ref 30.0–36.0)
MCV: 79.1 fL — ABNORMAL LOW (ref 80.0–100.0)
Platelets: 314 10*3/uL (ref 150–400)
RBC: 4.44 MIL/uL (ref 3.87–5.11)
RDW: 14.3 % (ref 11.5–15.5)
WBC: 6.4 10*3/uL (ref 4.0–10.5)
nRBC: 0 % (ref 0.0–0.2)

## 2021-07-14 LAB — POC INFLUENZA A AND B ANTIGEN (URGENT CARE ONLY)
INFLUENZA A ANTIGEN, POC: NEGATIVE
INFLUENZA B ANTIGEN, POC: NEGATIVE

## 2021-07-14 LAB — BASIC METABOLIC PANEL
Anion gap: 6 (ref 5–15)
BUN: 10 mg/dL (ref 6–20)
CO2: 24 mmol/L (ref 22–32)
Calcium: 9 mg/dL (ref 8.9–10.3)
Chloride: 106 mmol/L (ref 98–111)
Creatinine, Ser: 0.7 mg/dL (ref 0.44–1.00)
GFR, Estimated: >60 mL/min (ref >60)
Glucose, Bld: 100 mg/dL — ABNORMAL HIGH (ref 70–99)
Potassium: 3.6 mmol/L (ref 3.5–5.1)
Sodium: 136 mmol/L (ref 135–145)

## 2021-07-14 LAB — TROPONIN I (HIGH SENSITIVITY)
Troponin I (High Sensitivity): <2 ng/L (ref <18)
Troponin I (High Sensitivity): 85 ng/L — ABNORMAL HIGH (ref <18)

## 2021-07-14 LAB — BRAIN NATRIURETIC PEPTIDE: B Natriuretic Peptide: 20 pg/mL (ref 0.0–100.0)

## 2021-07-14 MED ORDER — METHYLPREDNISOLONE SODIUM SUCC 125 MG IJ SOLR
80.0000 mg | Freq: Once | INTRAMUSCULAR | Status: AC
  Administered 2021-07-14: 80 mg via INTRAMUSCULAR

## 2021-07-14 MED ORDER — METHYLPREDNISOLONE SODIUM SUCC 125 MG IJ SOLR
INTRAMUSCULAR | Status: AC
  Filled 2021-07-14: qty 2

## 2021-07-14 MED ORDER — ALBUTEROL SULFATE HFA 108 (90 BASE) MCG/ACT IN AERS
2.0000 | INHALATION_SPRAY | Freq: Once | RESPIRATORY_TRACT | Status: AC
  Administered 2021-07-14: 2 via RESPIRATORY_TRACT

## 2021-07-14 MED ORDER — ALBUTEROL SULFATE HFA 108 (90 BASE) MCG/ACT IN AERS
INHALATION_SPRAY | RESPIRATORY_TRACT | Status: AC
  Filled 2021-07-14: qty 6.7

## 2021-07-14 MED ORDER — ONDANSETRON 4 MG PO TBDP: 4.0000 mg | ORAL_TABLET | Freq: Three times a day (TID) | ORAL | 0 refills | Status: DC | PRN

## 2021-07-14 NOTE — ED Triage Notes (Signed)
Pt presents with c/o chest pain, cough, body aches, sore throat and headache x 3 days.

## 2021-07-14 NOTE — ED Triage Notes (Signed)
Pt to triage via [facility] from [facility].  Reports chest pain, cough, body aches, headache, and sore throat x 3 days.   Albuterol inhaler and Solu-medrol given at [facility].

## 2021-07-14 NOTE — ED Provider Notes (Signed)
Emergency Medicine Provider Triage Evaluation Note  Deanna Castaneda , a 30 y.o. female  was evaluated in triage.  Pt complains of chest pain, shortness of breath, headache x-rays yesterday.  Denies recent illness.  Reports chest pain woke her up this morning.  Worse with palpation.  She denies fever.   Review of Systems  Positive: As above Negative: As above  Physical Exam  BP 102/75 (BP Location: Left Arm)    Pulse 78    Temp 98.1 F (36.7 C) (Oral)    Resp 18    LMP 07/12/2021 (Exact Date)    SpO2 100%  Gen:   Awake, no distress   Resp:  Normal effort  MSK:   Moves extremities without difficulty  Other:    Medical Decision Making  Medically screening exam initiated at 5:39 PM.  Appropriate orders placed.  Deanna Castaneda was informed that the remainder of the evaluation will be completed by another provider, this initial triage assessment does not replace that evaluation, and the importance of remaining in the ED until their evaluation is complete.     Deanna [place], PA-C 07/14/21 1741    Lorre Nick, MD 07/17/21 [phone]

## 2021-07-14 NOTE — ED Notes (Signed)
When giving medications, patient reported having chest pain.  Prior to entering room, patient could be heard vomiting.  Notified Dorann Ou, PA

## 2021-07-14 NOTE — ED Provider Notes (Signed)
[facility]    CSN: [id number] Arrival date & time: 07/14/21  1415      History   Chief Complaint Chief Complaint  Patient presents with   chest congestion   Cough   Chills   Nausea   Vomiting   Diarrhea    HPI Deanna Castaneda is a 30 y.o. female.   Patient presents today with a 36-hour history of URI symptoms including chest tightness, cough, body aches, sore throat, headache, nausea.  Denies any shortness of breath, dizziness, syncope.  She does have a history of asthma and has been using albuterol inhaler without improvement of symptoms.  Reports household sick contacts and has recently tested positive for influenza.  She has not had influenza vaccine.  She does have a history of sickle cell trait.  Reports pain is severe she is having difficulty with daily duties as result of symptoms.  She does not smoke.  She is confident she is not pregnant.  She does not have a history of diabetes.   Past Medical History:  Diagnosis Date   Asthma    Sickle cell trait (HCC)     There are no problems to display for this patient.   Past Surgical History:  Procedure Laterality Date   CHOLECYSTECTOMY      OB History   No obstetric history on file.      Home Medications    Prior to Admission medications   Medication Sig Start Date End Date Taking? Authorizing Provider  ondansetron (ZOFRAN-ODT) 4 MG disintegrating tablet Take 1 tablet (4 mg total) by mouth every 8 (eight) hours as needed for nausea or vomiting. 07/14/21  Yes Kennia Vanvorst K, PA-C  albuterol (VENTOLIN HFA) 108 (90 Base) MCG/ACT inhaler Inhale 1 puff into the lungs every 6 (six) hours as needed for wheezing or shortness of breath.    [provider]  benzonatate (TESSALON) 100 MG capsule Take 1 capsule (100 mg total) by mouth every 8 (eight) hours. 11/15/20   Rushie Chestnut, PA-C  fluticasone (FLONASE) 50 MCG/ACT nasal spray Place 2 sprays into both nostrils daily. 11/15/20   Rushie Chestnut, PA-C  meloxicam (MOBIC) 7.5 MG tablet Take 1 tablet (7.5 mg total) by mouth daily. 02/08/21   Ivette Loyal, NP  NON FORMULARY Birth control pill    [provider]  oseltamivir (TAMIFLU) 75 MG capsule Take 1 capsule (75 mg total) by mouth every 12 (twelve) hours. 11/15/20   Rushie Chestnut, PA-C    Family History Family History  Problem Relation Age of Onset   Healthy Mother    Sleep apnea Father     Social History Social History   Tobacco Use   Smoking status: Never   Smokeless tobacco: Never  Vaping Use   Vaping Use: Never used  Substance Use Topics   Alcohol use: No   Drug use: No     Allergies   Patient has no known allergies.   Review of Systems Review of Systems  Constitutional:  Positive for activity change, appetite change, chills and fatigue. Negative for fever.  HENT:  Positive for congestion and sore throat. Negative for sinus pressure and sneezing.   Respiratory:  Positive for cough, chest tightness and shortness of breath. Negative for wheezing.   Cardiovascular:  Negative for chest pain.  Gastrointestinal:  Positive for abdominal pain, diarrhea and nausea. Negative for vomiting.  Musculoskeletal:  Positive for arthralgias and myalgias.  Neurological:  Positive for headaches. Negative for  dizziness and light-headedness.    Physical Exam Triage Vital Signs ED Triage Vitals  Enc Vitals Group     BP 07/14/21 1514 118/64     Pulse Rate 07/14/21 1514 77     Resp 07/14/21 1514 20     Temp 07/14/21 1514 98.5 F (36.9 C)     Temp Source 07/14/21 1514 Oral     SpO2 07/14/21 1514 100 %     Weight --      Height --      Head Circumference --      Peak Flow --      Pain Score 07/14/21 1513 3     Pain Loc --      Pain Edu? --      Excl. in GC? --    No data found.  Updated Vital Signs BP (!) 110/53 (BP Location: Left Arm) Comment (BP Location): large cuff   Pulse 86    Temp 98.5 F (36.9 C) (Oral)    Resp 20    LMP 07/12/2021 (Exact  Date)    SpO2 100%   Visual Acuity Right Eye Distance:   Left Eye Distance:   Bilateral Distance:    Right Eye Near:   Left Eye Near:    Bilateral Near:     Physical Exam Vitals reviewed.  Constitutional:      General: She is awake. She is not in acute distress.    Appearance: Normal appearance. She is well-developed. She is ill-appearing.     Comments: Very pleasant female appears stated age laying on exam room table uncomfortable but in no acute distress  HENT:     Head: Normocephalic and atraumatic.     Right Ear: External ear normal.     Left Ear: External ear normal.     Nose:     Right Sinus: No maxillary sinus tenderness or frontal sinus tenderness.     Left Sinus: No maxillary sinus tenderness or frontal sinus tenderness.     Mouth/Throat:     Mouth: Mucous membranes are moist.     Pharynx: Uvula midline. No oropharyngeal exudate or posterior oropharyngeal erythema.  Cardiovascular:     Rate and Rhythm: Normal rate and regular rhythm.     Heart sounds: Normal heart sounds, S1 normal and S2 normal. No murmur heard. Pulmonary:     Effort: Pulmonary effort is normal.     Breath sounds: Normal breath sounds. No wheezing, rhonchi or rales.     Comments: Clear to auscultation bilaterally Chest:     Chest wall: Tenderness present. No deformity or swelling.  Psychiatric:        Behavior: Behavior is cooperative.     UC Treatments / Results  Labs (all labs ordered are listed, but only abnormal results are displayed) Labs Reviewed  POC INFLUENZA A AND B ANTIGEN (URGENT CARE ONLY)    EKG   Radiology No results found.  Procedures Procedures (including critical care time)  Medications Ordered in UC Medications  albuterol (VENTOLIN HFA) 108 (90 Base) MCG/ACT inhaler 2 puff (2 puffs Inhalation Given 07/14/21 1535)  methylPREDNISolone sodium succinate (SOLU-MEDROL) 125 mg/2 mL injection 80 mg (80 mg Intramuscular Given 07/14/21 1535)    Initial Impression /  Assessment and Plan / UC Course  I have reviewed the triage vital signs and the nursing notes.  Pertinent labs & imaging results that were available during my care of the patient were reviewed by me and considered in my medical decision making (see  chart for details).     Patient reported severe shortness of breath and chest pain.  She was given albuterol and 80 mg of Solu-Medrol which provided no relief of symptoms.  EKG was obtained that showed normal sinus rhythm with ventricular rate of 79 bpm without ischemic changes.  Ordered chest x-ray but patient was unable to stand for chest x-ray we do not have capabilities of obtaining this with her lying down.  She reports she was unable to stand due to severe weakness and shortness of breath.  Flu testing was obtained and was negative.  Given persistent and severe symptoms discussed that the safest thing would be to go to the emergency room.  Patient was agreeable to this and given severe weakness and difficulty standing we will send her via [facility] as she is unaccompanied at today's visit.  Final Clinical Impressions(s) / UC Diagnoses   Final diagnoses:  Chest pain, unspecified type  SOB (shortness of breath)  Generalized weakness   Discharge Instructions   None    ED Prescriptions     Medication Sig Dispense Auth. Provider   ondansetron (ZOFRAN-ODT) 4 MG disintegrating tablet Take 1 tablet (4 mg total) by mouth every 8 (eight) hours as needed for nausea or vomiting. 20 tablet Jeramyah Goodpasture, Noberto Retort, PA-C      PDMP not reviewed this encounter.   Jeani Hawking, PA-C 07/14/21 1649

## 2021-07-14 NOTE — ED Notes (Signed)
Patient is being discharged from the Urgent Care and sent to the Emergency Department via [facility] . Per Dorann Ou PA, patient is in need of higher level of care due to severe chest pain, SOB and weakness. Patient is aware and verbalizes understanding of plan of care.  Vitals:   07/14/21 1514  BP: 118/64  Pulse: 77  Resp: 20  Temp: 98.5 F (36.9 C)  SpO2: 100%

## 2021-07-15 ENCOUNTER — Emergency Department (HOSPITAL_COMMUNITY): Payer: Self-pay

## 2021-07-15 ENCOUNTER — Encounter (HOSPITAL_COMMUNITY): Payer: Self-pay | Admitting: Student

## 2021-07-15 LAB — RESP PANEL BY RT-PCR (FLU A&B, COVID) ARPGX2
Influenza A by PCR: NEGATIVE
Influenza B by PCR: NEGATIVE
SARS Coronavirus 2 by RT PCR: NEGATIVE

## 2021-07-15 LAB — HEPATIC FUNCTION PANEL
ALT: 41 U/L (ref 0–44)
AST: 34 U/L (ref 15–41)
Albumin: 3.3 g/dL — ABNORMAL LOW (ref 3.5–5.0)
Alkaline Phosphatase: 43 U/L (ref 38–126)
Bilirubin, Direct: <0.1 mg/dL (ref 0.0–0.2)
Total Bilirubin: 0.5 mg/dL (ref 0.3–1.2)
Total Protein: 8.2 g/dL — ABNORMAL HIGH (ref 6.5–8.1)

## 2021-07-15 LAB — LIPASE, BLOOD: Lipase: 30 U/L (ref 11–51)

## 2021-07-15 LAB — TROPONIN I (HIGH SENSITIVITY)
Troponin I (High Sensitivity): <2 ng/L (ref <18)
Troponin I (High Sensitivity): <2 ng/L (ref <18)

## 2021-07-15 MED ORDER — SODIUM CHLORIDE 0.9 % IV BOLUS
1000.0000 mL | Freq: Once | INTRAVENOUS | Status: AC
  Administered 2021-07-15: 04:00:00 1000 mL via INTRAVENOUS

## 2021-07-15 MED ORDER — KETOROLAC TROMETHAMINE 15 MG/ML IJ SOLN
15.0000 mg | Freq: Once | INTRAMUSCULAR | Status: AC
  Administered 2021-07-15: 07:00:00 15 mg via INTRAVENOUS
  Filled 2021-07-15: qty 1

## 2021-07-15 MED ORDER — IBUPROFEN 800 MG PO TABS: 800.0000 mg | ORAL_TABLET | Freq: Three times a day (TID) | ORAL | 0 refills | Status: DC

## 2021-07-15 MED ORDER — ONDANSETRON HCL 4 MG/2ML IJ SOLN
4.0000 mg | Freq: Once | INTRAMUSCULAR | Status: AC
  Administered 2021-07-15: 04:00:00 4 mg via INTRAVENOUS
  Filled 2021-07-15: qty 2

## 2021-07-15 MED ORDER — MORPHINE SULFATE (PF) 4 MG/ML IV SOLN
4.0000 mg | Freq: Once | INTRAVENOUS | Status: AC
  Administered 2021-07-15: 04:00:00 4 mg via INTRAVENOUS
  Filled 2021-07-15: qty 1

## 2021-07-15 MED ORDER — COLCHICINE 0.6 MG PO TABS: 0.6000 mg | ORAL_TABLET | Freq: Every day | ORAL | 0 refills | Status: DC

## 2021-07-15 MED ORDER — IOHEXOL 350 MG/ML SOLN
65.0000 mL | Freq: Once | INTRAVENOUS | Status: AC | PRN
  Administered 2021-07-15: 06:00:00 65 mL via INTRAVENOUS

## 2021-07-15 NOTE — ED Provider Notes (Signed)
[facility] Provider Note   CSN: [id number] Arrival date & time: 07/14/21  1700     History Chief Complaint  Patient presents with   Chest Pain    Coy Henken is a 30 y.o. female with a hx of asthma, sickle cell trait, and prior cholecystectomy who presents to the ED from urgent care for evaluation of chest pain over the past 24 hours. Patient reports pain is to the chest of her chest, radiates into the back some, it is sharp in nature, worse when laying flat and with taking deep breaths, no alleviating factors. Current pain is moderate to severe. She has been having some congestion, headaches, dyspnea and wheezing for the past few days with generalized weakness. Starting coughing yesterday or today, dry cough. She went to urgent care where she received neb tx and steroids without improvement in her sxs, also had nausea with 2 episodes of clear emesis @ UC. She denies fever, hematemesis, abdominal pain, melena, leg pain/swelling, hemoptysis, recent surgery/trauma, recent long travel, hormone use, personal hx of cancer, or hx of DVT/PE. Denies family hx of early CAD or clotting disorders.     HPI     Past Medical History:  Diagnosis Date   Asthma    Sickle cell trait ([place])     There are no problems to display for this patient.   Past Surgical History:  Procedure Laterality Date   CHOLECYSTECTOMY       OB History   No obstetric history on file.     Family History  Problem Relation Age of Onset   Healthy Mother    Sleep apnea Father     Social History   Tobacco Use   Smoking status: Never   Smokeless tobacco: Never  Vaping Use   Vaping Use: Never used  Substance Use Topics   Alcohol use: No   Drug use: No    Home Medications Prior to Admission medications   Medication Sig Start Date End Date Taking? Authorizing Provider  albuterol (VENTOLIN HFA) 108 (90 Base) MCG/ACT inhaler Inhale 1 puff into the lungs every 6  (six) hours as needed for wheezing or shortness of breath.    [provider]  benzonatate (TESSALON) 100 MG capsule Take 1 capsule (100 mg total) by mouth every 8 (eight) hours. 11/15/20   Hughie Closs, PA-C  fluticasone (FLONASE) 50 MCG/ACT nasal spray Place 2 sprays into both nostrils daily. 11/15/20   Hughie Closs, PA-C  meloxicam (MOBIC) 7.5 MG tablet Take 1 tablet (7.5 mg total) by mouth daily. 02/08/21   Pearson Forster, NP  NON FORMULARY Birth control pill    [provider]  ondansetron (ZOFRAN-ODT) 4 MG disintegrating tablet Take 1 tablet (4 mg total) by mouth every 8 (eight) hours as needed for nausea or vomiting. 07/14/21   Raspet, Derry Skill, PA-C  oseltamivir (TAMIFLU) 75 MG capsule Take 1 capsule (75 mg total) by mouth every 12 (twelve) hours. 11/15/20   Hughie Closs, PA-C    Allergies    Patient has no known allergies.  Review of Systems   Review of Systems  Constitutional:  Positive for fatigue. Negative for chills and fever.  HENT:  Positive for congestion. Negative for ear pain and sore throat.   Respiratory:  Positive for cough, shortness of breath and wheezing.   Cardiovascular:  Positive for chest pain. Negative for leg swelling.  Gastrointestinal:  Positive for nausea and vomiting. Negative for abdominal pain  and diarrhea.  Neurological:  Positive for weakness and headaches. Negative for syncope.  All other systems reviewed and are negative.  Physical Exam Updated Vital Signs BP 113/76 (BP Location: Left Arm)    Pulse 80    Temp 98.8 F (37.1 C) (Oral)    Resp 16    LMP  (LMP Unknown)    SpO2 100%   Physical Exam Vitals and nursing note reviewed.  Constitutional:      General: She is not in acute distress.    Appearance: She is well-developed. She is not toxic-appearing.  HENT:     Head: Normocephalic and atraumatic.  Eyes:     General:        Right eye: No discharge.        Left eye: No discharge.     Conjunctiva/sclera:  Conjunctivae normal.  Cardiovascular:     Rate and Rhythm: Normal rate and regular rhythm.     Pulses:          Dorsalis pedis pulses are 2+ on the right side and 2+ on the left side.  Pulmonary:     Effort: Pulmonary effort is normal. No respiratory distress.     Breath sounds: Normal breath sounds. No wheezing, rhonchi or rales.  Chest:     Chest wall: Tenderness (anterior chest wall) present.  Abdominal:     General: There is no distension.     Palpations: Abdomen is soft.     Tenderness: There is no abdominal tenderness.  Musculoskeletal:     Cervical back: Neck supple.     Right lower leg: No edema.     Left lower leg: No edema.  Skin:    General: Skin is warm and dry.     Findings: No rash.  Neurological:     General: No focal deficit present.     Mental Status: She is alert.     Comments: Clear speech.   Psychiatric:        Behavior: Behavior normal.    ED Results / Procedures / Treatments   Labs (all labs ordered are listed, but only abnormal results are displayed) Labs Reviewed  CBC - Abnormal; Notable for the following components:      Result Value   HCT 35.1 (*)    MCV 79.1 (*)    All other components within normal limits  BASIC METABOLIC PANEL - Abnormal; Notable for the following components:   Glucose, Bld 100 (*)    All other components within normal limits  TROPONIN I (HIGH SENSITIVITY) - Abnormal; Notable for the following components:   Troponin I (High Sensitivity) 85 (*)    All other components within normal limits  RESP PANEL BY RT-PCR (FLU A&B, COVID) ARPGX2  BRAIN NATRIURETIC PEPTIDE  TROPONIN I (HIGH SENSITIVITY)  TROPONIN I (HIGH SENSITIVITY)    EKG EKG Interpretation  Date/Time:  Tuesday July 14 2021 17:16:01 EST Ventricular Rate:  73 PR Interval:  126 QRS Duration: 78 QT Interval:  408 QTC Calculation: 449 R Axis:   29 Text Interpretation: Normal sinus rhythm Nonspecific T wave abnormality Abnormal ECG similar to Jan 2020  Confirmed by Addison Lank [phone]) on 07/15/2021 3:25:54 AM  Radiology DG Chest 2 View  Result Date: 07/14/2021 CLINICAL DATA:  Chest pain radiating to back, weakness and vomiting EXAM: CHEST - 2 VIEW COMPARISON:  03/04/2019 FINDINGS: The heart size and mediastinal contours are within normal limits. Both lungs are clear. The visualized skeletal structures are unremarkable. IMPRESSION: No active cardiopulmonary  disease. Electronically Signed   By: Sharlet Salina M.D.   On: 07/14/2021 18:37   CT Angio Chest PE W/Cm &/Or Wo Cm  Result Date: 07/15/2021 CLINICAL DATA:  Chest pain, coughing and body aches, headache and sore throat. EXAM: CT ANGIOGRAPHY CHEST WITH CONTRAST TECHNIQUE: Multidetector CT imaging of the chest was performed using the standard protocol during bolus administration of intravenous contrast. Multiplanar CT image reconstructions and MIPs were obtained to evaluate the vascular anatomy. CONTRAST:  66mL OMNIPAQUE IOHEXOL 350 MG/ML SOLN COMPARISON:  Portable chest today and PA Lat chest 03/04/2019 FINDINGS: Cardiovascular: Satisfactory opacification of the pulmonary arteries to the segmental level. No evidence of pulmonary embolism. Normal heart size. No pericardial effusion. The great vessels are within normal limits. There is normal variant origin of the left vertebral artery from the aortic arch. Mediastinum/Nodes: No enlarged mediastinal, hilar, or axillary lymph nodes. Thyroid gland, trachea, and esophagus demonstrate no significant findings. 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 Lungs/Pleura: There are mild hazy and linear opacities in the posterior base of the lower lobes most likely all due to atelectasis, less likely combination of atelectasis and pneumonitis. The remaining lungs are clear. The central airways are free of filling defects Upper Abdomen: The gallbladder is absent. The  common bile duct is prominent measuring 8.7 mm with no intrahepatic biliary dilatation. No other significant upper abdominal findings. Musculoskeletal: No chest wall abnormality. No acute or significant osseous findings. Review of the MIP images confirms the above findings. IMPRESSION: 1. No acute CTA findings. 2. Linear and hazy opacities in the posterior lower lobes, most likely all is due to atelectasis, less likely a combination of atelectasis and pneumonitis. 3. 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 4. Prominence of the common bile duct with evidence of prior cholecystectomy. Laboratory and clinical correlation suggested. Electronically Signed   By: Almira Bar M.D.   On: 07/15/2021 06:17    Procedures Procedures   Medications Ordered in ED Medications - No data to display  ED Course  I have reviewed the triage vital signs and the nursing notes.  Pertinent labs & imaging results that were available during my care of the patient were reviewed by me and considered in my medical decision making (see chart for details).    MDM Rules/Calculators/A&P                           Patient presents to the emergency department with chest pain. Patient nontoxic appearing, in no apparent distress, vitals without significant abnormality. Fairly benign physical exam, some reproducibility of pain with chest wall palpation.   DDX: ACS, pulmonary embolism, dissection, pericarditis, pneumothorax, pneumonia, arrhythmia, severe anemia, MSK, GERD, anxiety, abdominal process, pleurisy, asthma exacerbation.   Additional history obtained:  Additional history obtained from chart review & nursing note review.   EKG: nonspecific T wave changes ,present previously, no STEMI.   Lab Tests:  I reviewed and interpreted labs, which included:  CBC/BMP: Unremarkable.  Hepatic function panel/lipase: Unremarkable  Covid/flu: Negative Troponin: <2, 85, <2, <2  Imaging Studies  ordered:  CXR ordered in triage, I subsequently ordered CTA of the chest I independently reviewed, formal radiology impression shows:  CXR: No active cardiopulmonary disease. CTA:  1. No acute CTA findings. 2. Linear and hazy opacities in the posterior lower lobes, most  likely all is due to atelectasis, less likely a combination of atelectasis and pneumonitis. 3. 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 4. Prominence of the common bile duct with evidence of prior cholecystectomy. Laboratory and clinical correlation suggested.  ED Course:  Patient has a low risk heart pathway score, her EKG does not show acute ischemia- similar nonspecific T wave changes seen on prior EKGs, while she did have 1 troponin elevation @ 85 question the accuracy of this as her initial as well as 2 subsequent were undetectable, I overall have a low suspicion for ACS. CTA was obtained & negative for PE, additional no findings of pneumonia, fluid overload, pneumothorax, or cardiac enlargement on imaging. There are findings of epicardial cyst noted- attending Dr. Leonette Monarch discussed with cardiothoracic surgeon Dr. Cyndia Bent- relays this is benign, no need for emergent intervention, can follow up in clinic. In terms of common bile duct prominence- LFTs are normal, abdomen is nontender. Labs overall reassuring. Covid/flu negative. Lungs clear- doubt acute asthma exacerbation. Question MSK pain/costochondritis vs. Pericarditis, no specific EKG changes of pericarditis however pain is positional and pleuritic- will tx with ibuprofen and colchicine with outpatient follow up. I discussed results, treatment plan, need for follow-up, and return precautions with the patient. Provided opportunity for questions, patient confirmed understanding and is in agreement with plan.   Findings and plan of care discussed with supervising physician Dr. Leonette Monarch who has provided guidance & is in agreement.   Portions of this  note were generated with [employer]. Dictation errors may occur despite best attempts at proofreading.   Final Clinical Impression(s) / ED Diagnoses Final diagnoses:  Chest pain, unspecified type    Rx / DC Orders ED Discharge Orders          Ordered    colchicine 0.6 MG tablet  Daily        07/15/21 0723    ibuprofen (ADVIL) 800 MG tablet  3 times daily        07/15/21 [id number]            Hennessy Wherley was evaluated in Emergency Department on 07/15/2021 for the symptoms described in the history of present illness. He/she was evaluated in the context of the global COVID-19 pandemic, which necessitated consideration that the patient might be at risk for infection with the SARS-CoV-2 virus that causes COVID-19. Institutional protocols and algorithms that pertain to the evaluation of patients at risk for COVID-19 are in a state of rapid change based on information released by regulatory bodies including the CDC and federal and state organizations. These policies and algorithms were followed during the patient's care in the ED.    Amaryllis Dyke, PA-C 07/15/21 0726    Fatima Blank, MD 07/15/21 [phone]

## 2021-07-15 NOTE — Discharge Instructions (Addendum)
You were seen in the emergency department today for chest pain. Your work-up in the emergency department has been overall reassuring. Your labs have been fairly normal and or similar to previous blood work you have had done. Your EKG and the enzyme we use to check your heart overall did not show an acute heart attack at this time. Your chest x-ray was normal. Your chest CT did show a cyst on the outside of your heart, an epicardial cyst, we would like you to follow up with Dr. Laneta Simmers, the cardiothoracic surgeon for this- call to schedule an appointment. We are treating you for possible pericarditis with ibuprofen and colchicine.   Ibuprofen- this is a nonsteroidal anti-inflammatory medication that will help with pain. Be sure to take this medication as prescribed with food, 1 pill every 8 hours,  It should be taken with food, as it can cause stomach upset, and more seriously, stomach bleeding. Do not take other nonsteroidal anti-inflammatory medications with this such as Advil, Motrin, Aleve, Mobic, Goodie Powder, or Motrin, meloxicam etc. As these are similar.   Colchicine- take this medication once per day.   You make take Tylenol per over the counter dosing with these medications.   We have prescribed you new medication(s) today. Discuss the medications prescribed today with your pharmacist as they can have adverse effects and interactions with your other medicines including over the counter and prescribed medications. Seek medical evaluation if you start to experience new or abnormal symptoms after taking one of these medicines, seek care immediately if you start to experience difficulty breathing, feeling of your throat closing, facial swelling, or rash as these could be indications of a more serious allergic reaction  We would like you to follow up closely with cardiology, information provided in your discharge instructions, as soon as possible- call today to schedule an appointment. Return to the ER  immediately should you experience any new or worsening symptoms including but not limited to return of pain, worsened pain, vomiting, shortness of breath, dizziness, lightheadedness, passing out, or any other concerns that you may have.

## 2021-10-05 ENCOUNTER — Encounter (HOSPITAL_COMMUNITY): Payer: Self-pay

## 2021-10-05 ENCOUNTER — Ambulatory Visit (HOSPITAL_COMMUNITY)
Admission: EM | Admit: 2021-10-05 | Discharge: 2021-10-05 | Disposition: A | Discharge status: Home / Self Care | Payer: Medicaid Other | Attending: Family Medicine | Admitting: Family Medicine

## 2021-10-05 ENCOUNTER — Other Ambulatory Visit: Payer: Self-pay

## 2021-10-05 DIAGNOSIS — Z20822 Contact with and (suspected) exposure to covid-19: Secondary | ICD-10-CM | POA: Insufficient documentation

## 2021-10-05 DIAGNOSIS — J069 Acute upper respiratory infection, unspecified: Secondary | ICD-10-CM | POA: Insufficient documentation

## 2021-10-05 DIAGNOSIS — J4521 Mild intermittent asthma with (acute) exacerbation: Secondary | ICD-10-CM | POA: Insufficient documentation

## 2021-10-05 DIAGNOSIS — R519 Headache, unspecified: Secondary | ICD-10-CM | POA: Insufficient documentation

## 2021-10-05 LAB — SARS CORONAVIRUS 2 (TAT 6-24 HRS): SARS Coronavirus 2: NEGATIVE

## 2021-10-05 MED ORDER — ALBUTEROL SULFATE HFA 108 (90 BASE) MCG/ACT IN AERS: 2.0000 | INHALATION_SPRAY | Freq: Four times a day (QID) | RESPIRATORY_TRACT | 0 refills | Status: DC | PRN

## 2021-10-05 MED ORDER — TRAMADOL HCL 50 MG PO TABS: 50.0000 mg | ORAL_TABLET | Freq: Four times a day (QID) | ORAL | 0 refills | Status: DC | PRN

## 2021-10-05 MED ORDER — PREDNISONE 20 MG PO TABS: 40.0000 mg | ORAL_TABLET | Freq: Every day | ORAL | 0 refills | Status: AC

## 2021-10-05 NOTE — Discharge Instructions (Addendum)
?  You have been swabbed for COVID, and the test will result in the next 24 hours. Our staff will call you if positive. If the test is positive, you should quarantine for 5 days.  ? ?Take tramadol 1 every 6 hours as needed for pain.  This 1 can make you sleepy or dizzy ? ?Take prednisone 20 mg, 2 tablets daily for 5 days ? ?Use your albuterol inhaler, 2 puffs every 4 hours as needed for shortness of breath or wheezing ?

## 2021-10-05 NOTE — ED Triage Notes (Signed)
Pt reports headaches, no relief after taking OTC medicine. States her asthma is worsening.  ?Pt states she has a sharp pain in chest when coughing.  ? ?

## 2021-10-05 NOTE — ED Provider Notes (Signed)
?[facility] ? ? ? ?CSN: [id number] ?Arrival date & time: 10/05/21  1644 ? ? ?  ? ?History   ?Chief Complaint ?Chief Complaint  ?Patient presents with  ? Headache  ? chest discomfort  ? asthma flare up  ? ? ?HPI ?Deanna Castaneda is a 31 y.o. female.  ? ? ?Headache ?Here for asthma worsening and for worsening headache. ? ?She states she has had a headache for about a month.  It is bitemporal when she has it.  Also it is gotten worse in the last 2 days.  In the last 2 days she has had malaise, subjective fever, and her asthma has worsened.  No nausea or vomiting. ? ?Past Medical History:  ?Diagnosis Date  ? Asthma   ? Sickle cell trait (HCC)   ? ? ?There are no problems to display for this patient. ? ? ?Past Surgical History:  ?Procedure Laterality Date  ? CHOLECYSTECTOMY    ? ? ?OB History   ?No obstetric history on file. ?  ? ? ? ?Home Medications   ? ?Prior to Admission medications   ?Medication Sig Start Date End Date Taking? Authorizing Provider  ?predniSONE (DELTASONE) 20 MG tablet Take 2 tablets (40 mg total) by mouth daily with breakfast for 5 days. 10/05/21 10/10/21 Yes Zoey Bidwell, Janace Aris, MD  ?traMADol (ULTRAM) 50 MG tablet Take 1 tablet (50 mg total) by mouth every 6 (six) hours as needed. 10/05/21  Yes Zenia Resides, MD  ?albuterol (VENTOLIN HFA) 108 (90 Base) MCG/ACT inhaler Inhale 2 puffs into the lungs every 6 (six) hours as needed for wheezing or shortness of breath. 10/05/21   Zenia Resides, MD  ?fluticasone (FLONASE) 50 MCG/ACT nasal spray Place 2 sprays into both nostrils daily. 11/15/20   Rushie Chestnut, PA-C  ?NON FORMULARY Birth control pill    [provider]  ?ondansetron (ZOFRAN-ODT) 4 MG disintegrating tablet Take 1 tablet (4 mg total) by mouth every 8 (eight) hours as needed for nausea or vomiting. 07/14/21   Raspet, Noberto Retort, PA-C  ?oseltamivir (TAMIFLU) 75 MG capsule Take 1 capsule (75 mg total) by mouth every 12 (twelve) hours. 11/15/20   Rushie Chestnut, PA-C   ? ? ?Family History ?Family History  ?Problem Relation Age of Onset  ? Healthy Mother   ? Sleep apnea Father   ? ? ?Social History ?Social History  ? ?Tobacco Use  ? Smoking status: Never  ? Smokeless tobacco: Never  ?Vaping Use  ? Vaping Use: Never used  ?Substance Use Topics  ? Alcohol use: No  ? Drug use: No  ? ? ? ?Allergies   ?Patient has no known allergies. ? ? ?Review of Systems ?Review of Systems  ?Neurological:  Positive for headaches.  ? ? ?Physical Exam ?Triage Vital Signs ?ED Triage Vitals  ?Enc Vitals Group  ?   BP 10/05/21 1819 117/76  ?   Pulse Rate 10/05/21 1818 75  ?   Resp 10/05/21 1818 17  ?   Temp 10/05/21 1819 99 ?F (37.2 ?C)  ?   Temp Source 10/05/21 1819 Oral  ?   SpO2 10/05/21 1818 97 %  ?   Weight --   ?   Height --   ?   Head Circumference --   ?   Peak Flow --   ?   Pain Score 10/05/21 1817 7  ?   Pain Loc --   ?   Pain Edu? --   ?  Excl. in GC? --   ? ?No data found. ? ?Updated Vital Signs ?BP 117/76 (BP Location: Right Arm)   Pulse 75   Temp 99 ?F (37.2 ?C) (Oral)   Resp 17   LMP 10/01/2021 (Exact Date)   SpO2 97%  ? ?Visual Acuity ?Right Eye Distance:   ?Left Eye Distance:   ?Bilateral Distance:   ? ?Right Eye Near:   ?Left Eye Near:    ?Bilateral Near:    ? ?Physical Exam ?Vitals reviewed.  ?Constitutional:   ?   General: She is not in acute distress. ?   Appearance: She is not toxic-appearing.  ?HENT:  ?   Right Ear: Tympanic membrane and ear canal normal.  ?   Left Ear: Tympanic membrane and ear canal normal.  ?   Nose: Nose normal.  ?   Mouth/Throat:  ?   Mouth: Mucous membranes are moist.  ?   Pharynx: No oropharyngeal exudate or posterior oropharyngeal erythema.  ?Eyes:  ?   Extraocular Movements: Extraocular movements intact.  ?   Conjunctiva/sclera: Conjunctivae normal.  ?   Pupils: Pupils are equal, round, and reactive to light.  ?Cardiovascular:  ?   Rate and Rhythm: Normal rate and regular rhythm.  ?   Heart sounds: No murmur heard. ?Pulmonary:  ?   Effort: Pulmonary  effort is normal. No respiratory distress.  ?   Breath sounds: No wheezing, rhonchi or rales.  ?Chest:  ?   Chest wall: No tenderness.  ?Musculoskeletal:  ?   Cervical back: Neck supple.  ?Lymphadenopathy:  ?   Cervical: No cervical adenopathy.  ?Skin: ?   Capillary Refill: Capillary refill takes less than 2 seconds.  ?   Coloration: Skin is not jaundiced or pale.  ?Neurological:  ?   General: No focal deficit present.  ?   Mental Status: She is alert and oriented to person, place, and time.  ?   Cranial Nerves: No cranial nerve deficit.  ?   Sensory: No sensory deficit.  ?   Motor: No weakness.  ?   Coordination: Coordination normal.  ?   Gait: Gait normal.  ?   Deep Tendon Reflexes: Reflexes normal.  ?Psychiatric:     ?   Behavior: Behavior normal.  ? ? ? ?UC Treatments / Results  ?Labs ?(all labs ordered are listed, but only abnormal results are displayed) ?Labs Reviewed  ?SARS CORONAVIRUS 2 (TAT 6-24 HRS)  ? ? ?EKG ? ? ?Radiology ?No results found. ? ?Procedures ?Procedures (including critical care time) ? ?Medications Ordered in UC ?Medications - No data to display ? ?Initial Impression / Assessment and Plan / UC Course  ?I have reviewed the triage vital signs and the nursing notes. ? ?Pertinent labs & imaging results that were available during my care of the patient were reviewed by me and considered in my medical decision making (see chart for details). ? ?  ? ?We will treat the headache with some tramadol, since I am going to send in prednisone for her asthma exacerbation.  Also we will swab for COVID.  With her BMI and her asthma, she is at risk for severe disease.  If positive, she should receive a prescription for Paxlovid.  Her renal function was normal in December ?Final Clinical Impressions(s) / UC Diagnoses  ? ?Final diagnoses:  ?Intractable headache, unspecified chronicity pattern, unspecified headache type  ?Viral URI  ?Mild intermittent asthma with (acute) exacerbation  ? ? ? ?Discharge  Instructions   ? ?  ? ?  You have been swabbed for COVID, and the test will result in the next 24 hours. Our staff will call you if positive. If the test is positive, you should quarantine for 5 days.  ? ?Take tramadol 1 every 6 hours as needed for pain.  This 1 can make you sleepy or dizzy ? ?Take prednisone 20 mg, 2 tablets daily for 5 days ? ?Use your albuterol inhaler, 2 puffs every 4 hours as needed for shortness of breath or wheezing ? ? ? ? ?ED Prescriptions   ? ? Medication Sig Dispense Auth. Provider  ? albuterol (VENTOLIN HFA) 108 (90 Base) MCG/ACT inhaler Inhale 2 puffs into the lungs every 6 (six) hours as needed for wheezing or shortness of breath. 1 each Zenia Resides, MD  ? predniSONE (DELTASONE) 20 MG tablet Take 2 tablets (40 mg total) by mouth daily with breakfast for 5 days. 10 tablet Zenia Resides, MD  ? traMADol (ULTRAM) 50 MG tablet Take 1 tablet (50 mg total) by mouth every 6 (six) hours as needed. 10 tablet Zenia Resides, MD  ? ?  ? ?I have reviewed the PDMP during this encounter. ?  ?Zenia Resides, MD ?10/05/21 1839 ? ?

## 2022-03-17 IMAGING — CT CT ANGIO CHEST
2 of 7 series · 18 of 46 positions shown · IV contrast (APPLIED)
Comparison: Portable chest today and PA Lat chest 03/04/2019

CLINICAL DATA: Chest pain, coughing and body aches, headache and
sore throat.

EXAM:
CT ANGIOGRAPHY CHEST WITH CONTRAST
TECHNIQUE: Multidetector CT imaging of the chest was performed using the
standard protocol during bolus administration of intravenous
contrast. Multiplanar CT image reconstructions and MIPs were
obtained to evaluate the vascular anatomy.
CONTRAST:  65mL OMNIPAQUE IOHEXOL 350 MG/ML SOLN

[Series 6: thins · axial · 0.66mm/px · z∈[+937,+1167]mm · 15 of 371 slices shown]
[im 21/371  lung]
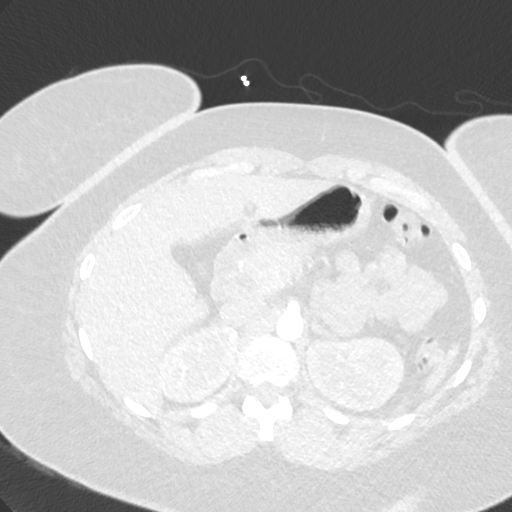
[im 42/371  soft-tissue]
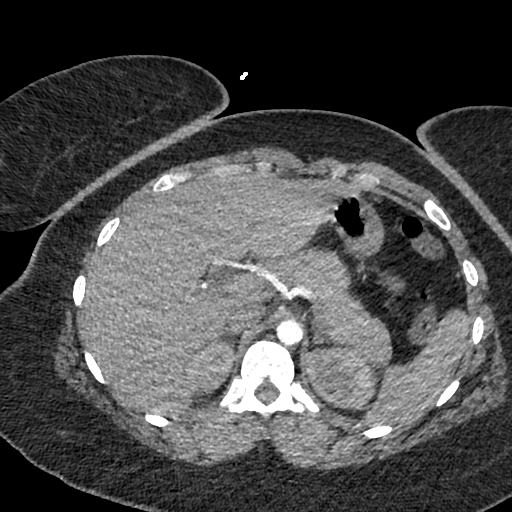
[im 62/371  lung]
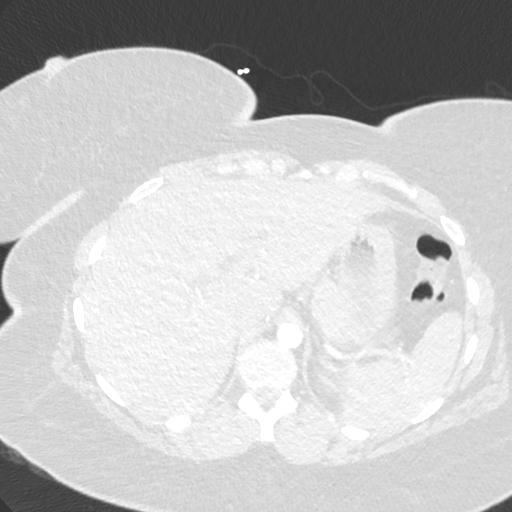
[im 83/371  soft-tissue]
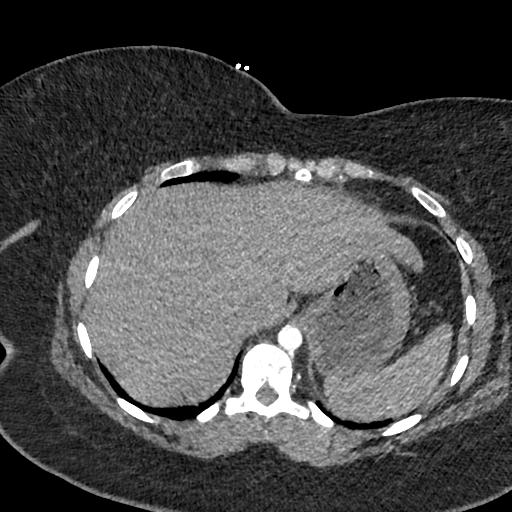
[im 124/371  lung]
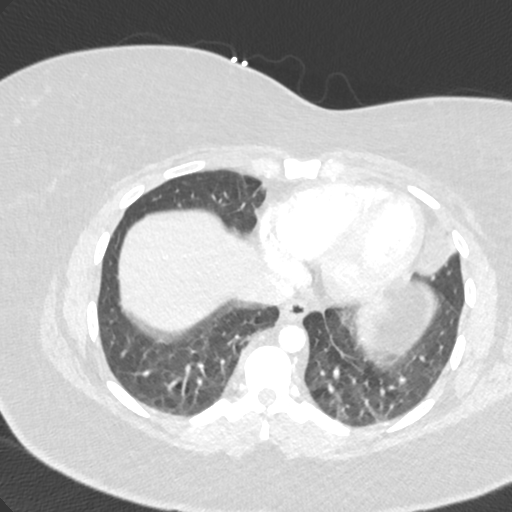
[im 144/371  soft-tissue]
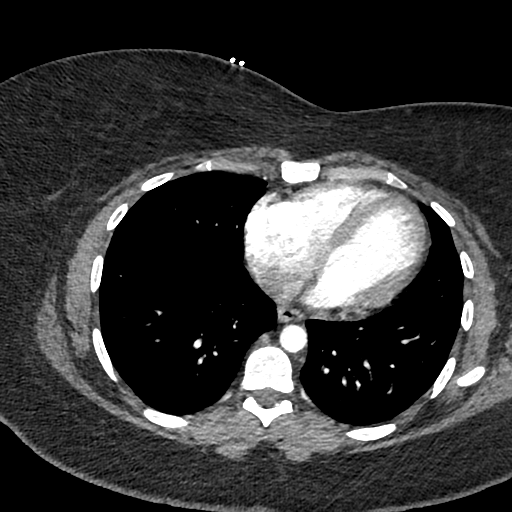
[im 165/371  lung]
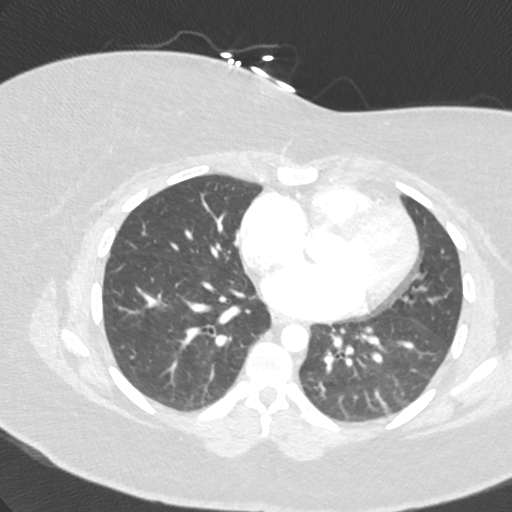
[im 186/371  soft-tissue]
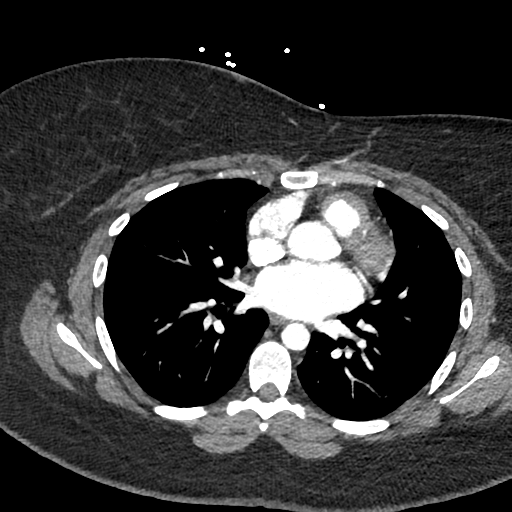
[im 206/371  lung]
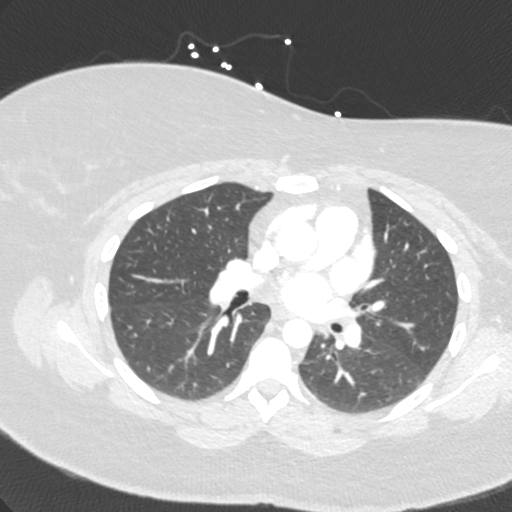
[im 227/371  soft-tissue]
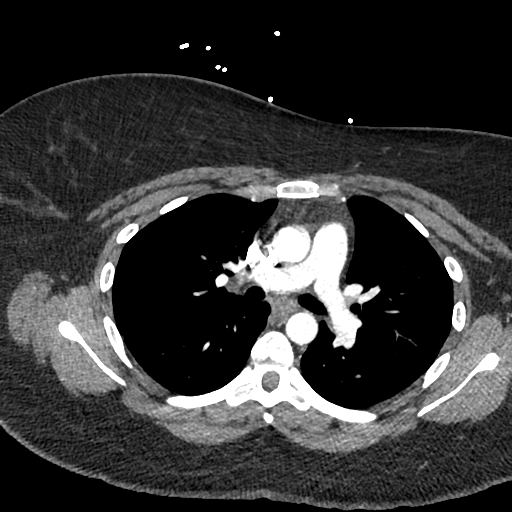
[im 247/371  lung]
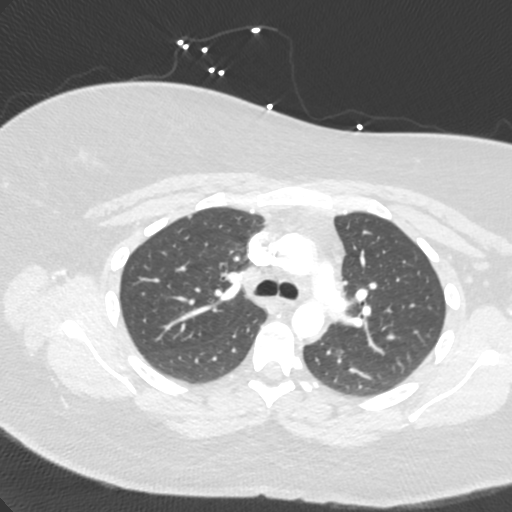
[im 288/371  soft-tissue]
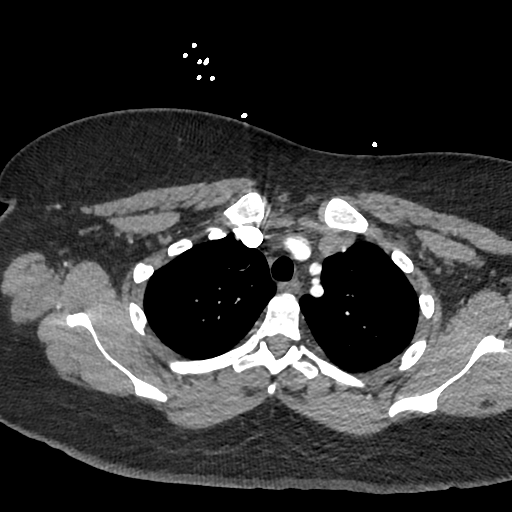
[im 309/371  lung]
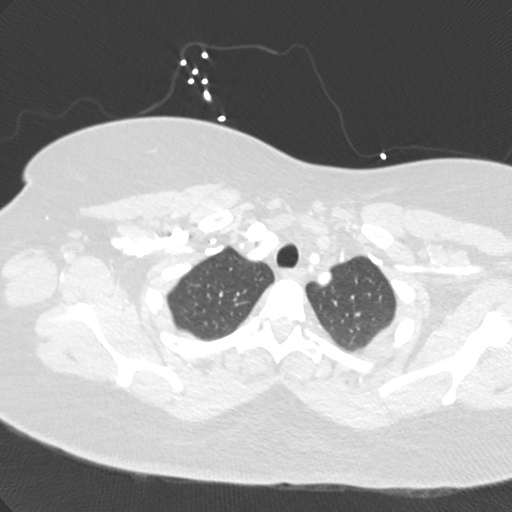
[im 329/371  soft-tissue]
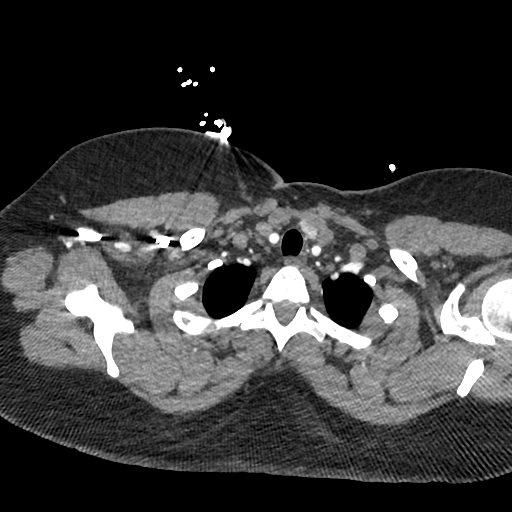
[im 350/371  lung]
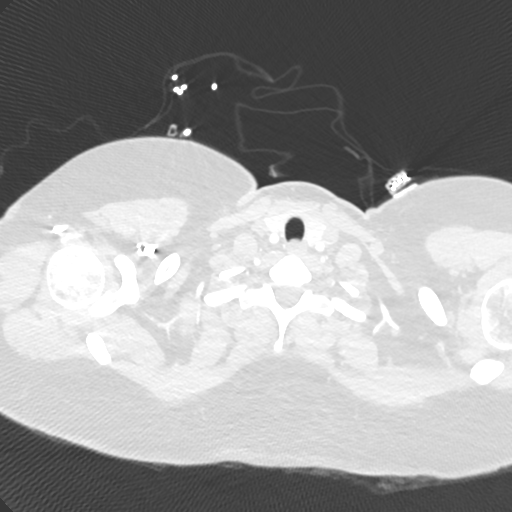

[Series 8: cor · coronal · 0.52mm/px · 3 of 145 slices shown]
[im 37/145  soft-tissue]
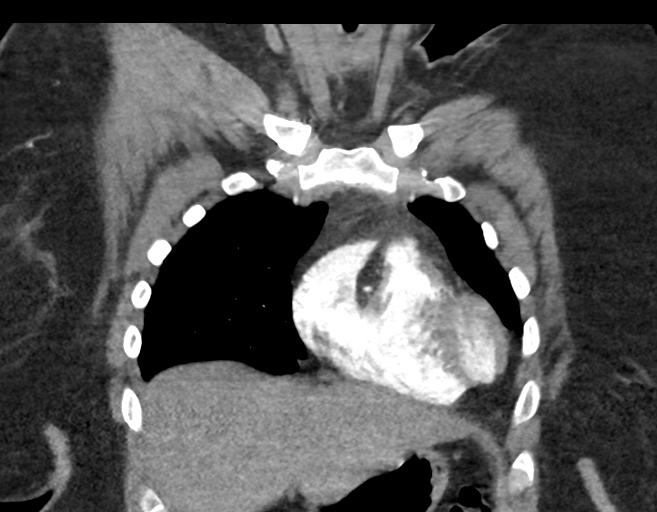
[im 73/145  soft-tissue]
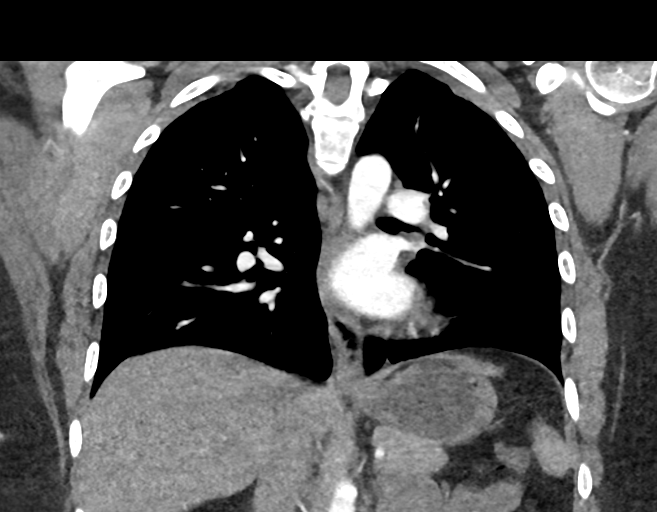
[im 109/145  soft-tissue]
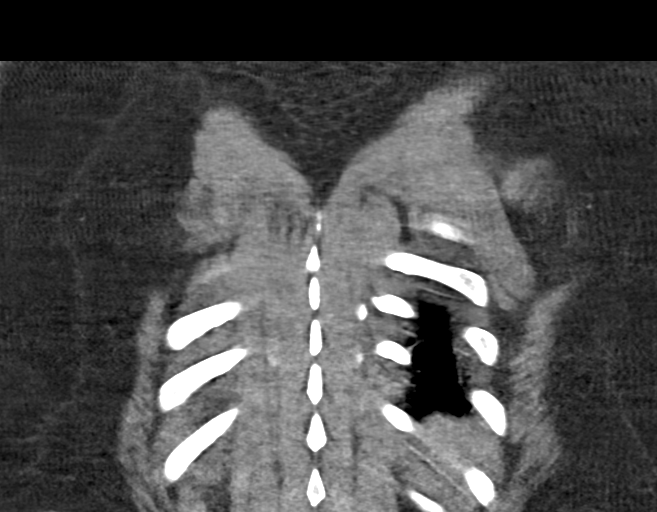

[18 of 46 positions shown; findings below may reference images not displayed]

FINDINGS: Cardiovascular: Satisfactory opacification of the pulmonary arteries
to the segmental level. No evidence of pulmonary embolism. Normal
heart size. No pericardial effusion. The great vessels are within
normal limits. There is normal variant origin of the left vertebral
artery from the aortic arch.

Mediastinum/Nodes: No enlarged mediastinal, hilar, or axillary lymph
nodes. Thyroid gland, trachea, and esophagus demonstrate no
significant findings. There is an epipericardial cyst along side the
lower lateral left heart border, measuring 3.5 x 2.2 by 2.0 cm and
-5.2 Hounsfield units.

Lungs/Pleura: There are mild hazy and linear opacities in the
posterior base of the lower lobes most likely all due to
atelectasis, less likely combination of atelectasis and pneumonitis.
The remaining lungs are clear. The central airways are free of
filling defects

Upper Abdomen: The gallbladder is absent. The common bile duct is
prominent measuring 8.7 mm with no intrahepatic biliary dilatation.
No other significant upper abdominal findings.

Musculoskeletal: No chest wall abnormality. No acute or significant
osseous findings.

Review of the MIP images confirms the above findings.
IMPRESSION: 1. No acute CTA findings.
2. Linear and hazy opacities in the posterior lower lobes, most
likely all is due to atelectasis, less likely a combination of
atelectasis and pneumonitis.
3. 3.5 x 2.2 x 2.0 cm epicardial cyst along the lower lateral left
heart border.
4. Prominence of the common bile duct with evidence of prior
cholecystectomy. Laboratory and clinical correlation suggested.

## 2022-11-05 ENCOUNTER — Other Ambulatory Visit: Payer: Self-pay

## 2022-11-05 ENCOUNTER — Emergency Department
Admission: EM | Admit: 2022-11-05 | Discharge: 2022-11-05 | Disposition: A | Discharge status: Home / Self Care | Payer: BC Managed Care – PPO | Attending: Emergency Medicine | Admitting: Emergency Medicine

## 2022-11-05 DIAGNOSIS — R519 Headache, unspecified: Secondary | ICD-10-CM | POA: Diagnosis present

## 2022-11-05 DIAGNOSIS — G43009 Migraine without aura, not intractable, without status migrainosus: Secondary | ICD-10-CM

## 2022-11-05 DIAGNOSIS — Z1152 Encounter for screening for COVID-19: Secondary | ICD-10-CM | POA: Insufficient documentation

## 2022-11-05 LAB — RESP PANEL BY RT-PCR (RSV, FLU A&B, COVID)  RVPGX2
Influenza A by PCR: NEGATIVE
Influenza B by PCR: NEGATIVE
Resp Syncytial Virus by PCR: NEGATIVE
SARS Coronavirus 2 by RT PCR: NEGATIVE

## 2022-11-05 MED ORDER — BUTALBITAL-APAP-CAFFEINE 50-325-40 MG PO TABS: 1.0000 | ORAL_TABLET | Freq: Four times a day (QID) | ORAL | 0 refills | Status: DC | PRN

## 2022-11-05 MED ORDER — ONDANSETRON 4 MG PO TBDP
4.0000 mg | ORAL_TABLET | Freq: Once | ORAL | Status: AC
  Administered 2022-11-05: 4 mg via ORAL
  Filled 2022-11-05: qty 1

## 2022-11-05 MED ORDER — KETOROLAC TROMETHAMINE 15 MG/ML IJ SOLN
15.0000 mg | Freq: Once | INTRAMUSCULAR | Status: AC
  Administered 2022-11-05: 15 mg via INTRAMUSCULAR
  Filled 2022-11-05: qty 1

## 2022-11-05 NOTE — ED Provider Notes (Signed)
[facility] Provider Note    Event Date/Time   First MD Initiated Contact with Patient 11/05/22 1357     (approximate)   History   Migraine   HPI  Deanna Castaneda is a 32 y.o. female with a past medical history of migraine headaches and obesity who presents today for evaluation of headache, body aches, and nasal congestion.  She reports that her headache began approximately 1 week ago, and her body aches, runny nose and nasal congestion began 2 days ago.  She reports that she was exposed to somebody who has both COVID and flu.  She is requesting a flu test today.  She denies fevers or neck pain or stiffness.  She has not had any vomiting.  She reports that her headache is unilateral and consistent with her previous migraine headaches.  There are no problems to display for this patient.         Physical Exam   Triage Vital Signs: ED Triage Vitals [11/05/22 1338]  Enc Vitals Group     BP 120/73     Pulse Rate 79     Resp 16     Temp 98.4 F (36.9 C)     Temp Source Oral     SpO2 99 %     Weight 220 lb 0.3 oz (99.8 kg)     Height 5\' 2"  (1.575 m)     Head Circumference      Peak Flow      Pain Score 10     Pain Loc      Pain Edu?      Excl. in GC?     Most recent vital signs: Vitals:   11/05/22 1338  BP: 120/73  Pulse: 79  Resp: 16  Temp: 98.4 F (36.9 C)  SpO2: 99%    Physical Exam Vitals and nursing note reviewed.  Constitutional:      General: Awake and alert. No acute distress.    Appearance: Normal appearance. The patient is obese.  HENT:     Head: Normocephalic and atraumatic.     Mouth: Mucous membranes are moist.  Eyes:     General: PERRL. Normal EOMs        Right eye: No discharge.        Left eye: No discharge.     Conjunctiva/sclera: Conjunctivae normal.  Cardiovascular:     Rate and Rhythm: Normal rate and regular rhythm.     Pulses: Normal pulses.  Pulmonary:     Effort: Pulmonary effort is normal. No  respiratory distress.     Breath sounds: Normal breath sounds.  Abdominal:     Abdomen is soft. There is no abdominal tenderness. No rebound or guarding. No distention. Musculoskeletal:        General: No swelling. Normal range of motion.     Cervical back: Normal range of motion and neck supple.  No nuchal rigidity. Skin:    General: Skin is warm and dry.     Capillary Refill: Capillary refill takes less than 2 seconds.     Findings: No rash.  Neurological:     Mental Status: The patient is awake and alert.   Neurological: GCS 15 alert and oriented x3 Normal speech, no expressive or receptive aphasia or dysarthria Cranial nerves II through XII intact Normal visual fields 5 out of 5 strength in all 4 extremities with intact sensation throughout No extremity drift Normal finger-to-nose testing, no limb or truncal ataxia   ED  Results / Procedures / Treatments   Labs (all labs ordered are listed, but only abnormal results are displayed) Labs Reviewed  RESP PANEL BY RT-PCR (RSV, FLU A&B, COVID)  [id number]     EKG     RADIOLOGY     PROCEDURES:  Critical Care performed:   Procedures   MEDICATIONS ORDERED IN ED: Medications  ketorolac (TORADOL) 15 MG/ML injection 15 mg (15 mg Intramuscular Given 11/05/22 1404)  ondansetron (ZOFRAN-ODT) disintegrating tablet 4 mg (4 mg Oral Given 11/05/22 1405)     IMPRESSION / MDM / ASSESSMENT AND PLAN / ED COURSE  I reviewed the triage vital signs and the nursing notes.   Differential diagnosis includes, but is not limited to, migraine headache, COVID, flu, RSV, sinusitis.  Patient is awake and alert, hemodynamically stable and neurologically and neurovascularly intact.  She is nontoxic in appearance.   Patient presented with a chief concern of a headache. Gradual in onset, without history or physical exam findings to suggest encephalopathy; no altered mental status, fever or meningismus, vision changes, vomiting or focal  neurological deficit and improved with treatment in the emergency department. Therefore, I have low suspicion for concerning process that would require urgent or emergent imaging or diagnostic/therapeutic procedural intervention such as lumbar puncture. Doubt meningitis as there is no fever, photophobia, neck symptoms, altered mental status, and symptoms have been ongoing for 1 week. Additionally the patient is not known to be immunocompromised. No history of trauma, doubt subdural or epidural hematoma. No dizziness or other neurologic symptoms so cerebellar infarction or other hemorrhagic stroke are unlikely. Intracranial mass unlikely given that the headache is not getting progressively worse, is not worse in the morning, there are no other neurologic symptoms, and the neurologic exam is grossly normal. Unlikely to be giant cell arteritis as there is no tenderness over temporal artery or vision changes. Doubt CO toxicity as no known exposure and no other family members have a headache. No neck pain and was not sudden onset or associated with movement of the neck and no dizziness, doubt carotid artery dissection. No occipital tenderness so occipital neuralgia seems less likely.   Given her COVID/flu exposure, obtained swab which was negative and patient is reassured by these findings..  Patient was treated symptomatically with improvement of her symptoms.  Return precautions discussed, patient to follow-up closely with outpatient provider.  Given her recurrent migraine headaches, she was given the information for neurologist.  She was given a prescription for Fioricet as requested, advised that this has caffeine in it and I advised against taking it at nighttime.  Also advised that she cannot drive, operate machinery, perform tasks require concentration while taking this medication.  We also discussed return precautions.  Patient understands and agrees with plan.  She was discharged in stable  condition.  Patient's presentation is most consistent with acute complicated illness / injury requiring diagnostic workup.     FINAL CLINICAL IMPRESSION(S) / ED DIAGNOSES   Final diagnoses:  Migraine without aura and without status migrainosus, not intractable     Rx / DC Orders   ED Discharge Orders          Ordered    butalbital-acetaminophen-caffeine (FIORICET) 50-325-40 MG tablet  Every 6 hours PRN        11/05/22 1438             Note:  This document was prepared using Dragon voice recognition software and may include unintentional dictation errors.   Jackelyn Hoehn, PA-C 11/05/22  1450    Phineas SemenGoodman, [place], MD 11/05/22 [phone]

## 2022-11-05 NOTE — Discharge Instructions (Signed)
You may take the medication as prescribed to help with your headaches.  Remember that it has caffeine in it as he may not want to take it at night.  Do not take more than prescribed.  Do not drive, operate heavy machinery, or perform any test that require concentration while taking this medication.  Please follow-up with neurology given your migraines.  Please return for any new, worsening, or change in symptoms or other concerns.  It was a pleasure caring for you today.

## 2022-11-05 NOTE — ED Triage Notes (Signed)
Pt here from [facility] with a migraine. Pt states she has a hx of same and takes medication but it has not helped. Pt also c/o body aches, states she was exposed to someone with covid/flu.

## 2023-04-23 ENCOUNTER — Encounter (HOSPITAL_COMMUNITY): Payer: Self-pay

## 2023-04-23 ENCOUNTER — Ambulatory Visit (HOSPITAL_COMMUNITY)
Admission: EM | Admit: 2023-04-23 | Discharge: 2023-04-23 | Disposition: A | Discharge status: Home / Self Care | Payer: BC Managed Care – PPO | Attending: Physician Assistant | Admitting: Physician Assistant

## 2023-04-23 DIAGNOSIS — J329 Chronic sinusitis, unspecified: Secondary | ICD-10-CM

## 2023-04-23 DIAGNOSIS — J4521 Mild intermittent asthma with (acute) exacerbation: Secondary | ICD-10-CM

## 2023-04-23 DIAGNOSIS — J40 Bronchitis, not specified as acute or chronic: Secondary | ICD-10-CM

## 2023-04-23 MED ORDER — ALBUTEROL SULFATE HFA 108 (90 BASE) MCG/ACT IN AERS: 2.0000 | INHALATION_SPRAY | Freq: Four times a day (QID) | RESPIRATORY_TRACT | 0 refills | Status: AC | PRN

## 2023-04-23 MED ORDER — AMOXICILLIN-POT CLAVULANATE 875-125 MG PO TABS: 1.0000 | ORAL_TABLET | Freq: Two times a day (BID) | ORAL | 0 refills | Status: DC

## 2023-04-23 MED ORDER — METHYLPREDNISOLONE SODIUM SUCC 125 MG IJ SOLR
60.0000 mg | Freq: Once | INTRAMUSCULAR | Status: AC
  Administered 2023-04-23: 60 mg via INTRAMUSCULAR

## 2023-04-23 MED ORDER — METHYLPREDNISOLONE SODIUM SUCC 125 MG IJ SOLR
INTRAMUSCULAR | Status: AC
  Filled 2023-04-23: qty 2

## 2023-04-23 MED ORDER — IPRATROPIUM-ALBUTEROL 0.5-2.5 (3) MG/3ML IN SOLN
3.0000 mL | Freq: Once | RESPIRATORY_TRACT | Status: AC
  Administered 2023-04-23: 3 mL via RESPIRATORY_TRACT

## 2023-04-23 MED ORDER — PREDNISONE 10 MG (21) PO TBPK: ORAL_TABLET | ORAL | 0 refills | Status: DC

## 2023-04-23 MED ORDER — IPRATROPIUM-ALBUTEROL 0.5-2.5 (3) MG/3ML IN SOLN
RESPIRATORY_TRACT | Status: AC
  Filled 2023-04-23: qty 3

## 2023-04-23 NOTE — Discharge Instructions (Signed)
I am glad that you are feeling better after the medication.  I believe that you have an asthma exacerbation as well as a infection.  Start Augmentin twice daily for 7 days.  Use over-the-counter medications including Mucinex and Flonase for symptom relief.  Use your albuterol inhaler every 4-6 hours as needed.  I have sent a refill to the pharmacy.  Take prednisone as prescribed.  Start this on 04/24/2023.  Do not take NSAIDs with this medication including aspirin, ibuprofen/Advil, naproxen/Aleve.  If your symptoms or not improving quickly please return for reevaluation.  If anything worsens you have worsening cough, shortness of breath, chest pain, fever, nausea/vomiting, weakness you need to be seen immediately.

## 2023-04-23 NOTE — ED Triage Notes (Signed)
Patient reports that she has been having chest pain, migraine, and a non productive cough x 1 week.  Patient states she has been taking OTC "cold and flu, sinus, medication, and cough medicine."

## 2023-04-23 NOTE — ED Provider Notes (Signed)
[facility]    CSN: [id number] Arrival date & time: 04/23/23  1507      History   Chief Complaint Chief Complaint  Patient presents with   Cough   Chest Pain   Migraine    HPI Deanna Castaneda is a 32 y.o. female.   Patient presents today with a week plus long history of URI symptoms.  She reports congestion, headache, sinus pressure, cough, chest tightness/burning in her chest whenever she is coughing.  Denies any nausea, vomiting, diarrhea.  Does report that her child was sick.  She recently had COVID about 1 week ago but is not feeling any better prompting evaluation.  She has tried multiple over-the-counter medications including cold and flu medicine without improvement of symptoms.  She does have a history of asthma and has been using her albuterol inhaler without improvement of symptoms.  Her last use was yesterday evening.  She denies any recent antibiotics or steroids.  She is confident that she is not pregnant she just got off her menstrual cycle.  She is not breast-feeding.  She is having difficulty with her daily activity as a result of symptoms.    Past Medical History:  Diagnosis Date   Asthma    Sickle cell trait (HCC)     There are no problems to display for this patient.   Past Surgical History:  Procedure Laterality Date   CHOLECYSTECTOMY      OB History   No obstetric history on file.      Home Medications    Prior to Admission medications   Medication Sig Start Date End Date Taking? Authorizing Provider  amoxicillin-clavulanate (AUGMENTIN) 875-125 MG tablet Take 1 tablet by mouth every 12 (twelve) hours. 04/23/23  Yes Tiahna Cure K, PA-C  predniSONE (STERAPRED UNI-PAK 21 TAB) 10 MG (21) TBPK tablet As directed 04/23/23  Yes Reyne Falconi K, PA-C  albuterol (VENTOLIN HFA) 108 (90 Base) MCG/ACT inhaler Inhale 2 puffs into the lungs every 6 (six) hours as needed for wheezing or shortness of breath. 04/23/23   Flavio Lindroth K, PA-C  fluticasone  (FLONASE) 50 MCG/ACT nasal spray Place 2 sprays into both nostrils daily. 11/15/20   Rushie Chestnut, PA-C    Family History Family History  Problem Relation Age of Onset   Healthy Mother    Sleep apnea Father     Social History Social History   Tobacco Use   Smoking status: Never   Smokeless tobacco: Never  Vaping Use   Vaping status: Never Used  Substance Use Topics   Alcohol use: No   Drug use: No     Allergies   Patient has no known allergies.   Review of Systems Review of Systems  Constitutional:  Positive for activity change. Negative for appetite change, fatigue and fever.  HENT:  Positive for congestion, postnasal drip and sinus pressure. Negative for sneezing and sore throat.   Respiratory:  Positive for cough, chest tightness and shortness of breath. Negative for wheezing.   Cardiovascular:  Negative for chest pain.  Gastrointestinal:  Negative for abdominal pain, diarrhea, nausea and vomiting.  Neurological:  Positive for headaches. Negative for dizziness and light-headedness.     Physical Exam Triage Vital Signs ED Triage Vitals  Encounter Vitals Group     BP 04/23/23 1542 107/82     Systolic BP Percentile --      Diastolic BP Percentile --      Pulse Rate 04/23/23 1542 86  Resp 04/23/23 1542 16     Temp 04/23/23 1542 98.4 F (36.9 C)     Temp Source 04/23/23 1542 Oral     SpO2 04/23/23 1542 98 %     Weight --      Height --      Head Circumference --      Peak Flow --      Pain Score 04/23/23 1545 9     Pain Loc --      Pain Education --      Exclude from Growth Chart --    No data found.  Updated Vital Signs BP 107/82 (BP Location: Left Arm)   Pulse 86   Temp 98.4 F (36.9 C) (Oral)   Resp 16   LMP 04/08/2023 (Approximate)   SpO2 98%   Visual Acuity Right Eye Distance:   Left Eye Distance:   Bilateral Distance:    Right Eye Near:   Left Eye Near:    Bilateral Near:     Physical Exam Vitals reviewed.  Constitutional:       General: She is awake. She is not in acute distress.    Appearance: Normal appearance. She is well-developed. She is not ill-appearing.     Comments: Very pleasant female appears stated age in no acute distress sitting comfortably in exam room  HENT:     Head: Normocephalic and atraumatic.     Right Ear: Tympanic membrane, ear canal and external ear normal. Tympanic membrane is not erythematous or bulging.     Left Ear: Tympanic membrane, ear canal and external ear normal. Tympanic membrane is not erythematous or bulging.     Nose:     Right Sinus: Maxillary sinus tenderness and frontal sinus tenderness present.     Left Sinus: Maxillary sinus tenderness and frontal sinus tenderness present.     Mouth/Throat:     Pharynx: Uvula midline. Postnasal drip present. No oropharyngeal exudate or posterior oropharyngeal erythema.  Cardiovascular:     Rate and Rhythm: Normal rate and regular rhythm.     Heart sounds: Normal heart sounds, S1 normal and S2 normal. No murmur heard. Pulmonary:     Effort: Pulmonary effort is normal.     Breath sounds: Examination of the right-lower field reveals decreased breath sounds. Examination of the left-lower field reveals decreased breath sounds. Decreased breath sounds present. No wheezing, rhonchi or rales.  Psychiatric:        Behavior: Behavior is cooperative.      UC Treatments / Results  Labs (all labs ordered are listed, but only abnormal results are displayed) Labs Reviewed - No data to display  EKG   Radiology No results found.  Procedures Procedures (including critical care time)  Medications Ordered in UC Medications  ipratropium-albuterol (DUONEB) 0.5-2.5 (3) MG/3ML nebulizer solution 3 mL (3 mLs Nebulization Given 04/23/23 1635)  methylPREDNISolone sodium succinate (SOLU-MEDROL) 125 mg/2 mL injection 60 mg (60 mg Intramuscular Given 04/23/23 1639)    Initial Impression / Assessment and Plan / UC Course  I have reviewed the  triage vital signs and the nursing notes.  Pertinent labs & imaging results that were available during my care of the patient were reviewed by me and considered in my medical decision making (see chart for details).     Patient is well-appearing, afebrile, nontoxic, nontachycardic.  Viral testing was deferred as she has been symptomatic for over a week and this would not change management.  She was given DuoNeb and Solu-Medrol in clinic  with significant improvement of symptoms.  Concern for secondary bacterial infection given recent of worsening of symptoms.  She was started on Augmentin twice daily.  Also concern for an active exacerbation and she was provided a refill of her albuterol with instruction to use this every 4-6 hours as needed.  She typically does not require regular use of albuterol so we will defer ICS/LABA for the time being but we discussed that this would be something to consider if her symptoms do not resolve.  Will treat with prednisone taper.  She was instructed to take NSAIDs with this medication.  She can use over-the-counter medication for symptom relief.  Discussed that if anything worsens or changes she should return for reevaluation.  Work excuse note provided.  Final Clinical Impressions(s) / UC Diagnoses   Final diagnoses:  Sinobronchitis  Mild intermittent asthma with acute exacerbation     Discharge Instructions      I am glad that you are feeling better after the medication.  I believe that you have an asthma exacerbation as well as a infection.  Start Augmentin twice daily for 7 days.  Use over-the-counter medications including Mucinex and Flonase for symptom relief.  Use your albuterol inhaler every 4-6 hours as needed.  I have sent a refill to the pharmacy.  Take prednisone as prescribed.  Start this on 04/24/2023.  Do not take NSAIDs with this medication including aspirin, ibuprofen/Advil, naproxen/Aleve.  If your symptoms or not improving quickly please return  for reevaluation.  If anything worsens you have worsening cough, shortness of breath, chest pain, fever, nausea/vomiting, weakness you need to be seen immediately.     ED Prescriptions     Medication Sig Dispense Auth. Provider   albuterol (VENTOLIN HFA) 108 (90 Base) MCG/ACT inhaler Inhale 2 puffs into the lungs every 6 (six) hours as needed for wheezing or shortness of breath. 18 g Mercy Leppla K, PA-C   predniSONE (STERAPRED UNI-PAK 21 TAB) 10 MG (21) TBPK tablet As directed 21 tablet Kayren Holck K, PA-C   amoxicillin-clavulanate (AUGMENTIN) 875-125 MG tablet Take 1 tablet by mouth every 12 (twelve) hours. 14 tablet Dorothea Yow, Noberto Retort, PA-C      PDMP not reviewed this encounter.   Jeani Hawking, PA-C 04/23/23 1720

## 2023-06-10 ENCOUNTER — Ambulatory Visit (INDEPENDENT_AMBULATORY_CARE_PROVIDER_SITE_OTHER): Payer: Self-pay

## 2023-06-10 ENCOUNTER — Encounter (HOSPITAL_COMMUNITY): Payer: Self-pay

## 2023-06-10 ENCOUNTER — Ambulatory Visit (HOSPITAL_COMMUNITY)
Admission: EM | Admit: 2023-06-10 | Discharge: 2023-06-10 | Disposition: A | Discharge status: Home / Self Care | Payer: Self-pay

## 2023-06-10 DIAGNOSIS — J069 Acute upper respiratory infection, unspecified: Secondary | ICD-10-CM

## 2023-06-10 DIAGNOSIS — R059 Cough, unspecified: Secondary | ICD-10-CM

## 2023-06-10 DIAGNOSIS — J4521 Mild intermittent asthma with (acute) exacerbation: Secondary | ICD-10-CM

## 2023-06-10 MED ORDER — AZITHROMYCIN 250 MG PO TABS: 250.0000 mg | ORAL_TABLET | Freq: Every day | ORAL | 0 refills | Status: DC

## 2023-06-10 MED ORDER — PREDNISONE 20 MG PO TABS: 40.0000 mg | ORAL_TABLET | Freq: Every day | ORAL | 0 refills | Status: AC

## 2023-06-10 MED ORDER — AMOXICILLIN-POT CLAVULANATE 875-125 MG PO TABS: 1.0000 | ORAL_TABLET | Freq: Two times a day (BID) | ORAL | 0 refills | Status: DC

## 2023-06-10 NOTE — Discharge Instructions (Addendum)
I do not see any obvious pneumonia on your x-ray, however I would like to cover you due to your symptoms and your duration.  Please take all antibiotics as prescribed until finished.  He can take them with food to prevent gastrointestinal upset.  Continue to use your albuterol inhaler as needed for any wheezing or shortness of breath.  Start the steroids tomorrow with breakfast.  Your symptoms should improve with antibiotics, if no improvement despite finishing antibiotics or any new concerning symptoms develop, please return to clinic.

## 2023-06-10 NOTE — ED Provider Notes (Signed)
[facility]    CSN: [id number] Arrival date & time: 06/10/23  1633      History   Chief Complaint Chief Complaint  Patient presents with   Asthma Exacerbation    HPI Deanna Castaneda is a 32 y.o. female.   Patient presents to clinic complaining of a productive cough with yellow/white mucus, wheezing, shortness of breath and a fever.  Symptoms have been ongoing for the past week and started with a bad headache.    History of asthma, she has been using her albuterol inhaler, last dose yesterday.  She had a fever of 102 this morning so she took Tylenol.  When she woke up she was sweating and still did not feel well.  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      The history is provided by the patient and medical records.    Past Medical History:  Diagnosis Date   Asthma    Sickle cell trait [employer])     Patient Active Problem List   Diagnosis Date Noted   Sickle cell trait (HCC) 01/27/2016   Chronic idiopathic constipation 01/27/2016   Asthma 01/27/2016    Past Surgical History:  Procedure Laterality Date   CHOLECYSTECTOMY      OB History   No obstetric history on file.      Home Medications    Prior to Admission medications   Medication Sig Start Date End Date Taking? Authorizing Provider  albuterol (VENTOLIN HFA) 108 (90 Base) MCG/ACT inhaler Inhale 2 puffs into the lungs every 6 (six) hours as needed for wheezing or shortness of breath. Patient taking differently: Inhale 2 puffs into the lungs every 6 (six) hours as needed for wheezing or shortness of breath. Last used: 1900. 04/23/23  Yes Raspet, Erin K, PA-C  amoxicillin-clavulanate (AUGMENTIN) 875-125 MG tablet Take 1 tablet by mouth every 12 (twelve) hours. 06/10/23  Yes Rinaldo Ratel, [place] N, FNP  azithromycin (ZITHROMAX) 250 MG tablet Take 1 tablet (250 mg total) by mouth daily. Take first 2 tablets together, then 1 every day until finished. 06/10/23  Yes Rinaldo Ratel,  [place] N, FNP  butalbital-acetaminophen-caffeine (FIORICET) [phone] MG tablet Take 1 tablet by mouth every 6 (six) hours as needed for headache or migraine. 11/05/22 11/05/23 Yes [provider]  predniSONE (DELTASONE) 20 MG tablet Take 2 tablets (40 mg total) by mouth daily for 5 days. 06/10/23 06/15/23 Yes Rinaldo Ratel, [place] N, FNP  promethazine-dextromethorphan (PROMETHAZINE-DM) 6.25-15 MG/5ML syrup Take 5 mLs by mouth 4 (four) times daily as needed for cough. 05/21/22  Yes [provider]  SUMAtriptan (IMITREX) 50 MG tablet Take 50 mg by mouth every 2 (two) hours as needed for migraine.   Yes [provider]  fluticasone (FLONASE) 50 MCG/ACT nasal spray Place 2 sprays into both nostrils daily. 11/15/20   Rushie Chestnut, PA-C    Family History Family History  Problem Relation Age of Onset   Healthy Mother    Sleep apnea Father     Social History Social History   Tobacco Use   Smoking status: Never   Smokeless tobacco: Never  Vaping Use   Vaping status: Never Used  Substance Use Topics   Alcohol use: No   Drug use: No     Allergies   Patient has no known allergies.   Review of Systems Review of Systems  Per HPI   Physical Exam Triage Vital Signs ED Triage Vitals  Encounter Vitals Group  BP 06/10/23 1758 109/76     Systolic BP Percentile --      Diastolic BP Percentile --      Pulse Rate 06/10/23 1758 90     Resp 06/10/23 1758 18     Temp 06/10/23 1758 98.2 F (36.8 C)     Temp Source 06/10/23 1758 Oral     SpO2 06/10/23 1758 97 %     Weight 06/10/23 1755 225 lb (102.1 kg)     Height 06/10/23 1755 5\' 2"  (1.575 m)     Head Circumference --      Peak Flow --      Pain Score 06/10/23 1753 7     Pain Loc --      Pain Education --      Exclude from Growth Chart --    No data found.  Updated Vital Signs BP 109/76 (BP Location: Left Arm)   Pulse 90   Temp 98.2 F (36.8 C) (Oral)   Resp 18   Ht 5\' 2"  (1.575 m)   Wt 225 lb  (102.1 kg)   LMP 05/23/2023 (Approximate)   SpO2 97%   BMI 41.15 kg/m   Visual Acuity Right Eye Distance:   Left Eye Distance:   Bilateral Distance:    Right Eye Near:   Left Eye Near:    Bilateral Near:     Physical Exam Vitals and nursing note reviewed.  Constitutional:      Appearance: Normal appearance.  HENT:     Head: Normocephalic and atraumatic.     Right Ear: External ear normal.     Left Ear: External ear normal.     Nose: Nose normal.     Mouth/Throat:     Mouth: Mucous membranes are moist.  Eyes:     Conjunctiva/sclera: Conjunctivae normal.  Cardiovascular:     Rate and Rhythm: Normal rate and regular rhythm.     Heart sounds: Normal heart sounds. No murmur heard. Pulmonary:     Effort: Pulmonary effort is normal.     Breath sounds: Decreased air movement present.  Musculoskeletal:        General: Normal range of motion.  Skin:    General: Skin is warm and dry.  Neurological:     General: No focal deficit present.     Mental Status: She is alert.  Psychiatric:        Mood and Affect: Mood normal.        Behavior: Behavior is cooperative.      UC Treatments / Results  Labs (all labs ordered are listed, but only abnormal results are displayed) Labs Reviewed - No data to display  EKG   Radiology No results found.  Procedures Procedures (including critical care time)  Medications Ordered in UC Medications - No data to display  Initial Impression / Assessment and Plan / UC Course  I have reviewed the triage vital signs and the nursing notes.  Pertinent labs & imaging results that were available during my care of the patient were reviewed by me and considered in my medical decision making (see chart for details).  Vitals and triage reviewed, patient is hemodynamically stable.  Subjective wheezing and shortness of breath.  Lungs are diminished in all lobes.  Imaging shows some wheeziness in lower lobes, official radiology overread is pending.   Will cover for asthma exacerbation and CAP due to duration and symptoms.  Prednisone burst sent in for asthma exacerbation.  Plan of care, follow-up care and  return precautions given, no questions at this time.  Work note provided.     Final Clinical Impressions(s) / UC Diagnoses   Final diagnoses:  Acute upper respiratory infection  Mild intermittent asthma with acute exacerbation     Discharge Instructions      I do not see any obvious pneumonia on your x-ray, however I would like to cover you due to your symptoms and your duration.  Please take all antibiotics as prescribed until finished.  He can take them with food to prevent gastrointestinal upset.  Continue to use your albuterol inhaler as needed for any wheezing or shortness of breath.  Start the steroids tomorrow with breakfast.  Your symptoms should improve with antibiotics, if no improvement despite finishing antibiotics or any new concerning symptoms develop, please return to clinic.      ED Prescriptions     Medication Sig Dispense Auth. Provider   predniSONE (DELTASONE) 20 MG tablet Take 2 tablets (40 mg total) by mouth daily for 5 days. 10 tablet Rinaldo Ratel, [place] N, FNP   azithromycin (ZITHROMAX) 250 MG tablet Take 1 tablet (250 mg total) by mouth daily. Take first 2 tablets together, then 1 every day until finished. 6 tablet Rinaldo Ratel, [place] N, FNP   amoxicillin-clavulanate (AUGMENTIN) 875-125 MG tablet Take 1 tablet by mouth every 12 (twelve) hours. 14 tablet Jahara Dail, [place] N, [place]      PDMP not reviewed this encounter.   Fahim Kats, [place] N, [place] 06/10/23 [phone]

## 2023-06-10 NOTE — ED Triage Notes (Signed)
"  Started with as a bad migraine about a week ago, now coughing, sneezing and recent Fever up to 102 this morning early". "Now with the cough having SOB".

## 2023-10-04 ENCOUNTER — Ambulatory Visit (HOSPITAL_COMMUNITY)
Admission: EM | Admit: 2023-10-04 | Discharge: 2023-10-04 | Disposition: A | Discharge status: Home / Self Care | Attending: Family Medicine | Admitting: Family Medicine

## 2023-10-04 ENCOUNTER — Ambulatory Visit (INDEPENDENT_AMBULATORY_CARE_PROVIDER_SITE_OTHER)

## 2023-10-04 ENCOUNTER — Encounter (HOSPITAL_COMMUNITY): Payer: Self-pay

## 2023-10-04 DIAGNOSIS — R0602 Shortness of breath: Secondary | ICD-10-CM

## 2023-10-04 DIAGNOSIS — G43919 Migraine, unspecified, intractable, without status migrainosus: Secondary | ICD-10-CM

## 2023-10-04 MED ORDER — ONDANSETRON 4 MG PO TBDP
ORAL_TABLET | ORAL | Status: AC
Start: 2023-10-04 — End: ?
  Filled 2023-10-04: qty 1

## 2023-10-04 MED ORDER — KETOROLAC TROMETHAMINE 30 MG/ML IJ SOLN
INTRAMUSCULAR | Status: AC
  Filled 2023-10-04: qty 1

## 2023-10-04 MED ORDER — DEXAMETHASONE SODIUM PHOSPHATE 10 MG/ML IJ SOLN
INTRAMUSCULAR | Status: AC
  Filled 2023-10-04: qty 1

## 2023-10-04 MED ORDER — KETOROLAC TROMETHAMINE 30 MG/ML IJ SOLN
60.0000 mg | Freq: Once | INTRAMUSCULAR | Status: AC
  Administered 2023-10-04: 60 mg via INTRAMUSCULAR

## 2023-10-04 MED ORDER — ONDANSETRON 4 MG PO TBDP
4.0000 mg | ORAL_TABLET | Freq: Once | ORAL | Status: AC
  Administered 2023-10-04: 4 mg via ORAL

## 2023-10-04 MED ORDER — DEXAMETHASONE SODIUM PHOSPHATE 10 MG/ML IJ SOLN
10.0000 mg | Freq: Once | INTRAMUSCULAR | Status: AC
  Administered 2023-10-04: 10 mg via INTRAMUSCULAR

## 2023-10-04 NOTE — ED Triage Notes (Signed)
 Patient here today with c/o headache X 1 week. Patient is also having some weakness at night and SOB off and on. She has been taking Theraflu with no relief. She has also been taking Ibuprofen during the day with some no relief. Patient was tested for flu last week but was negative.

## 2023-10-04 NOTE — Discharge Instructions (Signed)
Meds ordered this encounter  Medications   ketorolac (TORADOL) 30 MG/ML injection 60 mg   dexamethasone (DECADRON) injection 10 mg

## 2023-10-05 NOTE — ED Provider Notes (Signed)
 [facility]   [id number] 10/04/23 Arrival Time: 1906  ASSESSMENT & PLAN:  1. SOB (shortness of breath)   2. Intractable migraine without status migrainosus, unspecified migraine type    Without current SOB. I have personally viewed and independently interpreted the imaging studies ordered this visit. CXR: no acute changes appreciated.  Meds ordered this encounter  Medications   ketorolac (TORADOL) 30 MG/ML injection 60 mg   dexamethasone (DECADRON) injection 10 mg   ondansetron (ZOFRAN-ODT) disintegrating tablet 4 mg   She is comfortable with overnight home observation. Normal neurological exam. No indication for urgent neurodiagnostic imaging.  Recommend:  Follow-up Information     [facility] at [facility].   Specialty: Emergency Medicine Why: If symptoms worsen in any way. Contact information: [address] [place] [address] [phone]                 Reviewed expectations re: course of current medical issues. Questions answered. Outlined signs and symptoms indicating need for more acute intervention. Patient verbalized understanding. After Visit Summary given.   SUBJECTIVE: History from: Patient. Patient is able to give a clear and coherent history.  Deanna Castaneda is a 33 y.o. female who presents with complaint of a bad headache. Onset gradual, first noted approx one week ago. Location:  generalized  without radiation. History of headaches: yes. Precipitating factors include: none which have been determined. Associated symptoms: Preceding aura: no. Nausea/vomiting: mild nausea without emesis. Vision changes: denies. Increased sensitivity to light and to noises: mild. Fever: denies. Sinus pressure/congestion: denies. Extremity weakness: denies. OTC analgesics without much help. Current headache has limited normal daily activities. Denies head injury.  Also reports that she has been  feeling SOB "off and on for a few weeks"; without associated diaphoresis/CP. Denies current SOB.   OBJECTIVE:  Vitals:   10/04/23 1920 10/04/23 1921  BP: 121/80   Pulse: 69   Resp: 16   Temp: 99.4 F (37.4 C)   TempSrc: Oral   SpO2: 98%   Weight:  102.1 kg  Height:  5\' 2"  (1.575 m)    General appearance: alert; NAD but appears fatigued HENT: normocephalic; atraumatic Eyes: PERRLA; EOMI; conjunctivae normal Neck: supple with FROM Lungs: clear to auscultation bilaterally; unlabored respirations Heart: regular rate and rhythm Extremities: no edema; symmetrical with no gross deformities Skin: warm and dry Neurologic: alert; speech is fluent and clear without dysarthria or aphasia; CN 2-12 grossly intact; no facial droop; normal gait; normal symmetric reflexes; normal extremity strength and sensation throughout; bilateral upper and lower extremity sensation is grossly intact with 5/5 symmetric strength; normal grip strength Psychological: alert and cooperative; normal mood and affect   Imaging: DG Chest 2 View Result Date: 10/04/2023 CLINICAL DATA:  Headache with intermittent shortness of breath. EXAM: CHEST - 2 VIEW COMPARISON:  June 10, 2023 FINDINGS: The heart size and mediastinal contours are within normal limits. Both lungs are clear. Radiopaque surgical clips are seen within the right upper quadrant the visualized skeletal structures are unremarkable. IMPRESSION: No active cardiopulmonary disease. Electronically Signed   By: Aram Candela M.D.   On: 10/04/2023 21:33    No Known Allergies  Past Medical History:  Diagnosis Date   Asthma    Sickle cell trait (HCC)    Social History   Socioeconomic History   Marital status: Single    Spouse name: Not on file   Number of children: Not on file   Years of education: Not on  file   Highest education level: Not on file  Occupational History   Not on file  Tobacco Use   Smoking status: Never   Smokeless tobacco: Never   Vaping Use   Vaping status: Never Used  Substance and Sexual Activity   Alcohol use: No   Drug use: No   Sexual activity: Yes    Birth control/protection: None  Other Topics Concern   Not on file  Social History Narrative   Not on file   Social Drivers of Health   Financial Resource Strain: Not on file  Food Insecurity: Not on file  Transportation Needs: Not on file  Physical Activity: Not on file  Stress: Not on file  Social Connections: Unknown (12/06/2021)   Received from [facility]   Social Network    Social Network: Not on file  Intimate Partner Violence: Not At Risk (08/09/2023)   Received from [facility]   HITS    Over the last 12 months how often did your partner physically hurt you?: Never    Over the last 12 months how often did your partner insult you or talk down to you?: Never    Over the last 12 months how often did your partner threaten you with physical harm?: Never    Over the last 12 months how often did your partner scream or curse at you?: Never   Family History  Problem Relation Age of Onset   Healthy Mother    Sleep apnea Father    Past Surgical History:  Procedure Laterality Date   CHOLECYSTECTOMY        Mardella Layman, MD 10/05/23 1421
